# Patient Record
Sex: Female | Born: 1960 | Hispanic: No | Marital: Single | Smoking: Never smoker
Health system: Southern US, Community
[De-identification: ages and names within clinical notes are randomized; demographics above are authoritative.]

## PROBLEM LIST (undated history)

## (undated) ENCOUNTER — Ambulatory Visit (HOSPITAL_COMMUNITY): Admission: EM | Payer: Medicaid Other

## (undated) DIAGNOSIS — B20 Human immunodeficiency virus [HIV] disease: Secondary | ICD-10-CM

## (undated) DIAGNOSIS — I10 Essential (primary) hypertension: Secondary | ICD-10-CM

## (undated) DIAGNOSIS — Z21 Asymptomatic human immunodeficiency virus [HIV] infection status: Secondary | ICD-10-CM

## (undated) HISTORY — PX: TUBAL LIGATION: SHX77

---

## 2000-04-25 ENCOUNTER — Emergency Department (HOSPITAL_COMMUNITY): Admission: EM | Admit: 2000-04-25 | Discharge: 2000-04-25 | Payer: Self-pay | Admitting: Emergency Medicine

## 2001-05-07 ENCOUNTER — Encounter: Payer: Self-pay | Admitting: Emergency Medicine

## 2001-05-07 ENCOUNTER — Emergency Department (HOSPITAL_COMMUNITY): Admission: EM | Admit: 2001-05-07 | Discharge: 2001-05-07 | Payer: Self-pay | Admitting: Emergency Medicine

## 2001-05-10 ENCOUNTER — Emergency Department (HOSPITAL_COMMUNITY): Admission: EM | Admit: 2001-05-10 | Discharge: 2001-05-10 | Payer: Self-pay | Admitting: Emergency Medicine

## 2001-05-20 ENCOUNTER — Emergency Department (HOSPITAL_COMMUNITY): Admission: EM | Admit: 2001-05-20 | Discharge: 2001-05-20 | Payer: Self-pay | Admitting: Emergency Medicine

## 2012-10-04 ENCOUNTER — Emergency Department (HOSPITAL_BASED_OUTPATIENT_CLINIC_OR_DEPARTMENT_OTHER)
Admission: EM | Admit: 2012-10-04 | Discharge: 2012-10-04 | Disposition: A | Discharge status: Home / Self Care | Payer: Medicaid Other | Attending: Emergency Medicine | Admitting: Emergency Medicine

## 2012-10-04 ENCOUNTER — Encounter (HOSPITAL_BASED_OUTPATIENT_CLINIC_OR_DEPARTMENT_OTHER): Payer: Self-pay | Admitting: Family Medicine

## 2012-10-04 DIAGNOSIS — I10 Essential (primary) hypertension: Secondary | ICD-10-CM | POA: Insufficient documentation

## 2012-10-04 DIAGNOSIS — Z7982 Long term (current) use of aspirin: Secondary | ICD-10-CM | POA: Insufficient documentation

## 2012-10-04 DIAGNOSIS — B20 Human immunodeficiency virus [HIV] disease: Secondary | ICD-10-CM | POA: Insufficient documentation

## 2012-10-04 DIAGNOSIS — F17200 Nicotine dependence, unspecified, uncomplicated: Secondary | ICD-10-CM | POA: Insufficient documentation

## 2012-10-04 DIAGNOSIS — L0291 Cutaneous abscess, unspecified: Secondary | ICD-10-CM

## 2012-10-04 DIAGNOSIS — Z79899 Other long term (current) drug therapy: Secondary | ICD-10-CM | POA: Insufficient documentation

## 2012-10-04 DIAGNOSIS — L02419 Cutaneous abscess of limb, unspecified: Secondary | ICD-10-CM | POA: Insufficient documentation

## 2012-10-04 HISTORY — DX: Essential (primary) hypertension: I10

## 2012-10-04 HISTORY — DX: Asymptomatic human immunodeficiency virus (hiv) infection status: Z21

## 2012-10-04 HISTORY — DX: Human immunodeficiency virus (HIV) disease: B20

## 2012-10-04 MED ORDER — OXYCODONE-ACETAMINOPHEN 5-325 MG PO TABS: 1.0000 | ORAL_TABLET | Freq: Four times a day (QID) | ORAL | Status: DC | PRN

## 2012-10-04 MED ORDER — OXYCODONE-ACETAMINOPHEN 5-325 MG PO TABS
2.0000 | ORAL_TABLET | Freq: Once | ORAL | Status: AC
  Administered 2012-10-04: 2 via ORAL
  Filled 2012-10-04 (×2): qty 2

## 2012-10-04 MED ORDER — ONDANSETRON 4 MG PO TBDP
4.0000 mg | ORAL_TABLET | Freq: Once | ORAL | Status: AC
  Administered 2012-10-04: 4 mg via ORAL
  Filled 2012-10-04: qty 1

## 2012-10-04 MED ORDER — CEPHALEXIN 500 MG PO CAPS: 500.0000 mg | ORAL_CAPSULE | Freq: Four times a day (QID) | ORAL | Status: DC

## 2012-10-04 MED ORDER — SULFAMETHOXAZOLE-TRIMETHOPRIM 800-160 MG PO TABS: 1.0000 | ORAL_TABLET | Freq: Two times a day (BID) | ORAL | Status: DC

## 2012-10-04 NOTE — ED Provider Notes (Signed)
History     CSN: [id number]  Arrival date & time 10/04/12  1615   First MD Initiated Contact with Patient 10/04/12 1759      Chief Complaint  Patient presents with  . Abscess    (Consider location/radiation/quality/duration/timing/severity/associated sxs/prior treatment) HPI Comments: Patient presents with complaint of abscess on right inner thigh X 2 weeks. Patient states that the area is painful and has been draining yellow pus. She has not tried any OTC or home remedies. Denies fever or chills. Denies NVD or abdominal pain.   The history is provided by the patient. No language interpreter was used.    Past Medical History  Diagnosis Date  . Hypertension   . HIV (human immunodeficiency virus infection)     Past Surgical History  Procedure Date  . Tubal ligation     No family history on file.  History  Substance Use Topics  . Smoking status: Current Some Day Smoker  . Smokeless tobacco: Not on file  . Alcohol Use: Yes    OB History    Grav Para Term Preterm Abortions TAB SAB Ect Mult Living                  Review of Systems  Constitutional: Negative for fever and chills.  Gastrointestinal: Negative for nausea, vomiting, abdominal pain and diarrhea.  Skin: Positive for wound.    Allergies  Review of patient's allergies indicates no known allergies.  Home Medications   Current Outpatient Rx  Name  Route  Sig  Dispense  Refill  . ASPIRIN 81 MG PO TABS   Oral   Take 81 mg by mouth daily.         . TRUVADA PO   Oral   Take by mouth.         Marland Kitchen HYDROCHLOROTHIAZIDE 12.5 MG PO CAPS   Oral   Take 12.5 mg by mouth daily.         Marland Kitchen LISINOPRIL PO   Oral   Take by mouth.         Marland Kitchen ZOLOFT PO   Oral   Take by mouth.         Marland Kitchen SIMVASTATIN 10 MG PO TABS   Oral   Take 10 mg by mouth at bedtime.         Marland Kitchen GEODON PO   Oral   Take by mouth.           BP 120/103  Pulse 54  Temp 98.2 F (36.8 C) (Oral)  Resp 18  Ht 5\' 3"  (1.6 m)   Wt 215 lb (97.523 kg)  BMI 38.09 kg/m2  SpO2 99%  Physical Exam  Nursing note and vitals reviewed. Constitutional: She appears well-developed and well-nourished.  HENT:  Head: Normocephalic and atraumatic.  Mouth/Throat: Oropharynx is clear and moist.  Eyes: Conjunctivae normal and EOM are normal. No scleral icterus.  Neck: Normal range of motion. Neck supple.  Cardiovascular: Normal rate, regular rhythm and normal heart sounds.   Pulmonary/Chest: Effort normal and breath sounds normal.  Abdominal: Soft. There is no tenderness.  Lymphadenopathy:    She has no cervical adenopathy.  Neurological: She is alert.  Skin: Skin is warm and dry.       ED Course  Procedures (including critical care time)  Labs Reviewed - No data to display No results found.   1. Abscess       MDM  Patient presented with complaint of abscess. Patient became tearful, expressed fear  of needles, and refused I & D. Complication and risks explained to patient. Patient instructed to use warm compress and attempt to express pus at home. Discharged on bactrim and Keflex. Return precautions given verbally and in discharge summary. No red flags for cellulitis.        Pixie Casino, PA-C 10/04/12 2247

## 2012-10-04 NOTE — ED Notes (Signed)
Pt c/o abscess to right inner thigh x 2 wks. Pt sts it drains intermittently.

## 2012-10-05 NOTE — ED Provider Notes (Signed)
Medical screening examination/treatment/procedure(s) were performed by non-physician practitioner and as supervising physician I was immediately available for consultation/collaboration.   Rolan Bucco, MD 10/05/12 [phone]

## 2013-01-22 ENCOUNTER — Emergency Department (HOSPITAL_BASED_OUTPATIENT_CLINIC_OR_DEPARTMENT_OTHER)
Admission: EM | Admit: 2013-01-22 | Discharge: 2013-01-22 | Disposition: A | Discharge status: Home / Self Care | Payer: Medicaid Other | Attending: Emergency Medicine | Admitting: Emergency Medicine

## 2013-01-22 ENCOUNTER — Encounter (HOSPITAL_BASED_OUTPATIENT_CLINIC_OR_DEPARTMENT_OTHER): Payer: Self-pay | Admitting: *Deleted

## 2013-01-22 DIAGNOSIS — K0889 Other specified disorders of teeth and supporting structures: Secondary | ICD-10-CM

## 2013-01-22 DIAGNOSIS — Z7982 Long term (current) use of aspirin: Secondary | ICD-10-CM | POA: Insufficient documentation

## 2013-01-22 DIAGNOSIS — F17200 Nicotine dependence, unspecified, uncomplicated: Secondary | ICD-10-CM | POA: Insufficient documentation

## 2013-01-22 DIAGNOSIS — L02214 Cutaneous abscess of groin: Secondary | ICD-10-CM

## 2013-01-22 DIAGNOSIS — I10 Essential (primary) hypertension: Secondary | ICD-10-CM | POA: Insufficient documentation

## 2013-01-22 DIAGNOSIS — L02219 Cutaneous abscess of trunk, unspecified: Secondary | ICD-10-CM | POA: Insufficient documentation

## 2013-01-22 DIAGNOSIS — K089 Disorder of teeth and supporting structures, unspecified: Secondary | ICD-10-CM | POA: Insufficient documentation

## 2013-01-22 DIAGNOSIS — Z79899 Other long term (current) drug therapy: Secondary | ICD-10-CM | POA: Insufficient documentation

## 2013-01-22 DIAGNOSIS — B20 Human immunodeficiency virus [HIV] disease: Secondary | ICD-10-CM | POA: Insufficient documentation

## 2013-01-22 DIAGNOSIS — L03319 Cellulitis of trunk, unspecified: Secondary | ICD-10-CM | POA: Insufficient documentation

## 2013-01-22 MED ORDER — HYDROCODONE-ACETAMINOPHEN 5-325 MG PO TABS: 1.0000 | ORAL_TABLET | ORAL | Status: DC | PRN

## 2013-01-22 MED ORDER — CEPHALEXIN 500 MG PO CAPS: 500.0000 mg | ORAL_CAPSULE | Freq: Three times a day (TID) | ORAL | Status: DC

## 2013-01-22 MED ORDER — SULFAMETHOXAZOLE-TRIMETHOPRIM 800-160 MG PO TABS: 1.0000 | ORAL_TABLET | Freq: Two times a day (BID) | ORAL | Status: DC

## 2013-01-22 MED ORDER — OXYCODONE-ACETAMINOPHEN 5-325 MG PO TABS
1.0000 | ORAL_TABLET | Freq: Once | ORAL | Status: AC
  Administered 2013-01-22: 1 via ORAL
  Filled 2013-01-22 (×2): qty 1

## 2013-01-22 NOTE — ED Notes (Signed)
Pt states she has had a boil to the right groin x 2 weeks and a toothache that just started.

## 2013-01-22 NOTE — ED Provider Notes (Signed)
Medical screening examination/treatment/procedure(s) were performed by non-physician practitioner and as supervising physician I was immediately available for consultation/collaboration.   Charles B. Bernette Mayers, MD 01/22/13 [phone]

## 2013-01-22 NOTE — ED Provider Notes (Signed)
History     CSN: [id number]  Arrival date & time 01/22/13  1427   First MD Initiated Contact with Patient 01/22/13 1453      Chief Complaint  Patient presents with  . Recurrent Skin Infections    (Consider location/radiation/quality/duration/timing/severity/associated sxs/prior Treatment)  HPI Christine Ward is a 52 y.o. female who presents to the ED with a skin problem. There is an area in the right groin that has been tender for 2 weeks and has been draining for the past 2 days. This is a recurrent problem. She was here a few months back for the same problem and was treated with 2 antibiotics. The area went completely away and she was doing well until 2 days ago. She denies vaginal discharge, fever, nausea or vomiting or abdominal pain. She also complains of a tooth ache. The history was provided by the patient.  Past Medical History  Diagnosis Date  . Hypertension   . HIV (human immunodeficiency virus infection)     Past Surgical History  Procedure Laterality Date  . Tubal ligation      History reviewed. No pertinent family history.  History  Substance Use Topics  . Smoking status: Current Some Day Smoker  . Smokeless tobacco: Not on file  . Alcohol Use: Yes    OB History   Grav Para Term Preterm Abortions TAB SAB Ect Mult Living                  Review of Systems  Constitutional: Negative for fever, chills, diaphoresis and fatigue.  HENT: Positive for dental problem. Negative for ear pain, congestion, sore throat, facial swelling, neck pain, neck stiffness and sinus pressure.   Eyes: Negative for photophobia, pain and discharge.  Respiratory: Negative for cough, chest tightness and wheezing.   Cardiovascular: Negative for chest pain and palpitations.  Gastrointestinal: Negative for nausea, vomiting, abdominal pain, diarrhea, constipation and abdominal distention.  Genitourinary: Negative for dysuria, frequency, flank pain, vaginal bleeding, vaginal discharge,  difficulty urinating and pelvic pain.  Musculoskeletal: Negative for myalgias, back pain and gait problem.  Skin:       Abscess right inguinal area.  Allergic/Immunologic: Positive for immunocompromised state.  Neurological: Negative for dizziness, speech difficulty, weakness, light-headedness, numbness and headaches.  Psychiatric/Behavioral: Negative for confusion and agitation. The patient is not nervous/anxious.     Allergies  Review of patient's allergies indicates no known allergies.  Home Medications   Current Outpatient Rx  Name  Route  Sig  Dispense  Refill  . aspirin 81 MG tablet   Oral   Take 81 mg by mouth daily.         . cephALEXin (KEFLEX) 500 MG capsule   Oral   Take 1 capsule (500 mg total) by mouth 4 (four) times daily.   40 capsule   0   . Emtricitabine-Tenofovir (TRUVADA PO)   Oral   Take by mouth.         . hydrochlorothiazide (MICROZIDE) 12.5 MG capsule   Oral   Take 12.5 mg by mouth daily.         Marland Kitchen LISINOPRIL PO   Oral   Take by mouth.         . oxyCODONE-acetaminophen (PERCOCET/ROXICET) 5-325 MG per tablet   Oral   Take 1 tablet by mouth every 6 (six) hours as needed for pain.   10 tablet   0   . Sertraline HCl (ZOLOFT PO)   Oral   Take by mouth.         Marland Kitchen  simvastatin (ZOCOR) 10 MG tablet   Oral   Take 10 mg by mouth at bedtime.         . sulfamethoxazole-trimethoprim (SEPTRA DS) 800-160 MG per tablet   Oral   Take 1 tablet by mouth 2 (two) times daily.   20 tablet   0   . Ziprasidone HCl (GEODON PO)   Oral   Take by mouth.           BP 129/70  Pulse 60  Temp(Src) 98.2 F (36.8 C) (Oral)  Resp 20  Ht 5\' 3"  (1.6 m)  Wt 208 lb (94.348 kg)  BMI 36.85 kg/m2  SpO2 99%  Physical Exam  Nursing note and vitals reviewed. Constitutional: She is oriented to person, place, and time. She appears well-developed and well-nourished. No distress.  HENT:  Head: Normocephalic and atraumatic.  Nose: Nose normal.    Mouth/Throat: Uvula is midline, oropharynx is clear and moist and mucous membranes are normal.    Eyes: EOM are normal.  Neck: Neck supple.  Cardiovascular: Normal rate.   Pulmonary/Chest: Effort normal.  Abdominal: Soft. There is no tenderness. There is no CVA tenderness.  Abscess that is draining right inguinal area. Tender on palpation.   Musculoskeletal: Normal range of motion.  Neurological: She is alert and oriented to person, place, and time. No cranial nerve deficit.  Skin: Skin is warm and dry.  Psychiatric: Her speech is normal and behavior is normal. Judgment and thought content normal. Her mood appears anxious. Cognition and memory are normal.   Procedures  Assessment: 52 y.o. female with right inguinal abscess   Dental pain   HIV  Plan:  Antibiotics   Pain management   Follow up with PCP Discussed with the patient and all questioned fully answered. She will return if any problems arise.    Medication List    TAKE these medications       cephALEXin 500 MG capsule  Commonly known as:  KEFLEX  Take 1 capsule (500 mg total) by mouth 3 (three) times daily.     HYDROcodone-acetaminophen 5-325 MG per tablet  Commonly known as:  NORCO/VICODIN  Take 1 tablet by mouth every 4 (four) hours as needed for pain.     sulfamethoxazole-trimethoprim 800-160 MG per tablet  Commonly known as:  SEPTRA DS  Take 1 tablet by mouth every 12 (twelve) hours.      ASK your doctor about these medications       aspirin 81 MG tablet  Take 81 mg by mouth daily.     GEODON PO  Take by mouth.     hydrochlorothiazide 12.5 MG capsule  Commonly known as:  MICROZIDE  Take 12.5 mg by mouth daily.     LISINOPRIL PO  Take by mouth.     oxyCODONE-acetaminophen 5-325 MG per tablet  Commonly known as:  PERCOCET/ROXICET  Take 1 tablet by mouth every 6 (six) hours as needed for pain.     simvastatin 10 MG tablet  Commonly known as:  ZOCOR  Take 10 mg by mouth at bedtime.     TRUVADA  PO  Take by mouth.     ZOLOFT PO  Take by mouth.           Janne Napoleon, [place] 01/22/13 1549

## 2013-06-23 ENCOUNTER — Emergency Department (HOSPITAL_BASED_OUTPATIENT_CLINIC_OR_DEPARTMENT_OTHER)
Admission: EM | Admit: 2013-06-23 | Discharge: 2013-06-23 | Disposition: A | Discharge status: Home / Self Care | Payer: Medicaid Other | Attending: Emergency Medicine | Admitting: Emergency Medicine

## 2013-06-23 ENCOUNTER — Encounter (HOSPITAL_BASED_OUTPATIENT_CLINIC_OR_DEPARTMENT_OTHER): Payer: Self-pay | Admitting: *Deleted

## 2013-06-23 DIAGNOSIS — T63461A Toxic effect of venom of wasps, accidental (unintentional), initial encounter: Secondary | ICD-10-CM | POA: Insufficient documentation

## 2013-06-23 DIAGNOSIS — T6391XA Toxic effect of contact with unspecified venomous animal, accidental (unintentional), initial encounter: Secondary | ICD-10-CM | POA: Insufficient documentation

## 2013-06-23 DIAGNOSIS — I10 Essential (primary) hypertension: Secondary | ICD-10-CM | POA: Insufficient documentation

## 2013-06-23 DIAGNOSIS — Y929 Unspecified place or not applicable: Secondary | ICD-10-CM | POA: Insufficient documentation

## 2013-06-23 DIAGNOSIS — Y939 Activity, unspecified: Secondary | ICD-10-CM | POA: Insufficient documentation

## 2013-06-23 DIAGNOSIS — Z7982 Long term (current) use of aspirin: Secondary | ICD-10-CM | POA: Insufficient documentation

## 2013-06-23 DIAGNOSIS — Z21 Asymptomatic human immunodeficiency virus [HIV] infection status: Secondary | ICD-10-CM | POA: Insufficient documentation

## 2013-06-23 DIAGNOSIS — R21 Rash and other nonspecific skin eruption: Secondary | ICD-10-CM | POA: Insufficient documentation

## 2013-06-23 DIAGNOSIS — Z79899 Other long term (current) drug therapy: Secondary | ICD-10-CM | POA: Insufficient documentation

## 2013-06-23 DIAGNOSIS — F17200 Nicotine dependence, unspecified, uncomplicated: Secondary | ICD-10-CM | POA: Insufficient documentation

## 2013-06-23 MED ORDER — NAPROXEN 500 MG PO TABS: 500.0000 mg | ORAL_TABLET | Freq: Two times a day (BID) | ORAL | Status: DC

## 2013-06-23 MED ORDER — DIPHENHYDRAMINE HCL 25 MG PO TABS: 25.0000 mg | ORAL_TABLET | Freq: Four times a day (QID) | ORAL | Status: DC | PRN

## 2013-06-23 NOTE — ED Provider Notes (Signed)
Medical screening examination/treatment/procedure(s) were performed by non-physician practitioner and as supervising physician I was immediately available for consultation/collaboration.  Dayan Desa, MD 06/23/13 2051 

## 2013-06-23 NOTE — ED Provider Notes (Signed)
CSN: [id number]     Arrival date & time 06/23/13  1814 History     First MD Initiated Contact with Patient 06/23/13 1826     Chief Complaint  Patient presents with  . Insect Bite   (Consider location/radiation/quality/duration/timing/severity/associated sxs/prior Treatment) HPI Comments: Patient is a 52 year old female with a past medical history of hypertension and HIV who presents after being stung by a bee on her right breast prior to arrival. Patient reports the bee flew into her shirt and stung her breast. Patient reports sudden onset of burning pain to her right breast without radiation. No aggravating/alleviating factors. No associated symptoms. Patient denies throat closing, SOB, difficulty swallowing.    Past Medical History  Diagnosis Date  . Hypertension   . HIV (human immunodeficiency virus infection)    Past Surgical History  Procedure Laterality Date  . Tubal ligation     No family history on file. History  Substance Use Topics  . Smoking status: Current Some Day Smoker  . Smokeless tobacco: Not on file  . Alcohol Use: Yes   OB History   Grav Para Term Preterm Abortions TAB SAB Ect Mult Living                 Review of Systems  Skin: Positive for rash.  All other systems reviewed and are negative.    Allergies  Review of patient's allergies indicates no known allergies.  Home Medications   Current Outpatient Rx  Name  Route  Sig  Dispense  Refill  . aspirin 81 MG tablet   Oral   Take 81 mg by mouth daily.         . cephALEXin (KEFLEX) 500 MG capsule   Oral   Take 1 capsule (500 mg total) by mouth 3 (three) times daily.   30 capsule   0   . Emtricitabine-Tenofovir (TRUVADA PO)   Oral   Take by mouth.         . hydrochlorothiazide (MICROZIDE) 12.5 MG capsule   Oral   Take 12.5 mg by mouth daily.         Marland Kitchen HYDROcodone-acetaminophen (NORCO/VICODIN) 5-325 MG per tablet   Oral   Take 1 tablet by mouth every 4 (four) hours as needed for  pain.   10 tablet   0   . LISINOPRIL PO   Oral   Take by mouth.         . oxyCODONE-acetaminophen (PERCOCET/ROXICET) 5-325 MG per tablet   Oral   Take 1 tablet by mouth every 6 (six) hours as needed for pain.   10 tablet   0   . Sertraline HCl (ZOLOFT PO)   Oral   Take by mouth.         . simvastatin (ZOCOR) 10 MG tablet   Oral   Take 10 mg by mouth at bedtime.         . sulfamethoxazole-trimethoprim (SEPTRA DS) 800-160 MG per tablet   Oral   Take 1 tablet by mouth every 12 (twelve) hours.   20 tablet   0   . Ziprasidone HCl (GEODON PO)   Oral   Take by mouth.          BP 138/77  Pulse 72  Temp(Src) 97.9 F (36.6 C) (Oral)  Resp 18  Ht 5\' 3"  (1.6 m)  Wt 214 lb (97.07 kg)  BMI 37.92 kg/m2  SpO2 100% Physical Exam  Nursing note and vitals reviewed. Constitutional: She is oriented  to person, place, and time. She appears well-developed and well-nourished. No distress.  HENT:  Head: Normocephalic and atraumatic.  Eyes: Conjunctivae are normal.  Neck: Normal range of motion.  Cardiovascular: Normal rate and regular rhythm.  Exam reveals no gallop and no friction rub.   No murmur heard. Pulmonary/Chest: Effort normal and breath sounds normal. She has no wheezes. She has no rales. She exhibits no tenderness.  Abdominal: Soft. She exhibits no distension. There is no tenderness. There is no rebound and no guarding.  Musculoskeletal: Normal range of motion.  Neurological: She is alert and oriented to person, place, and time. Coordination normal.  Speech is goal-oriented. Moves limbs without ataxia.   Skin: Skin is warm and dry.  Localized erythema with central sting mark to right upper lateral breast that is mildly tender to palpate.   Psychiatric: She has a normal mood and affect. Her behavior is normal.    ED Course   Procedures (including critical care time)  Labs Reviewed - No data to display No results found.  1. Bee sting, initial encounter      MDM  6:36 PM Patient reassured the reaction is localized to her right breast. Patient denies wheezing, SOB, throat closing. Patient will have benadryl and Naprosyn. No further evaluation needed at this time.   Emilia Beck, PA-C 06/23/13 1845

## 2013-06-23 NOTE — ED Notes (Signed)
Bee sting to her right breast just now. No allergic reaction symptoms.

## 2014-11-21 ENCOUNTER — Emergency Department (HOSPITAL_BASED_OUTPATIENT_CLINIC_OR_DEPARTMENT_OTHER)
Admission: EM | Admit: 2014-11-21 | Discharge: 2014-11-21 | Disposition: A | Discharge status: Home / Self Care | Payer: Medicaid Other | Attending: Emergency Medicine | Admitting: Emergency Medicine

## 2014-11-21 ENCOUNTER — Encounter (HOSPITAL_BASED_OUTPATIENT_CLINIC_OR_DEPARTMENT_OTHER): Payer: Self-pay | Admitting: Emergency Medicine

## 2014-11-21 ENCOUNTER — Emergency Department (HOSPITAL_BASED_OUTPATIENT_CLINIC_OR_DEPARTMENT_OTHER): Payer: Medicaid Other

## 2014-11-21 DIAGNOSIS — M542 Cervicalgia: Secondary | ICD-10-CM | POA: Insufficient documentation

## 2014-11-21 DIAGNOSIS — B20 Human immunodeficiency virus [HIV] disease: Secondary | ICD-10-CM

## 2014-11-21 DIAGNOSIS — I10 Essential (primary) hypertension: Secondary | ICD-10-CM | POA: Diagnosis not present

## 2014-11-21 DIAGNOSIS — R51 Headache: Secondary | ICD-10-CM | POA: Diagnosis present

## 2014-11-21 DIAGNOSIS — G44209 Tension-type headache, unspecified, not intractable: Secondary | ICD-10-CM | POA: Diagnosis not present

## 2014-11-21 DIAGNOSIS — R519 Headache, unspecified: Secondary | ICD-10-CM

## 2014-11-21 DIAGNOSIS — Z791 Long term (current) use of non-steroidal anti-inflammatories (NSAID): Secondary | ICD-10-CM | POA: Diagnosis not present

## 2014-11-21 DIAGNOSIS — Z792 Long term (current) use of antibiotics: Secondary | ICD-10-CM | POA: Insufficient documentation

## 2014-11-21 DIAGNOSIS — Z79899 Other long term (current) drug therapy: Secondary | ICD-10-CM | POA: Insufficient documentation

## 2014-11-21 DIAGNOSIS — Z21 Asymptomatic human immunodeficiency virus [HIV] infection status: Secondary | ICD-10-CM | POA: Insufficient documentation

## 2014-11-21 DIAGNOSIS — Z7982 Long term (current) use of aspirin: Secondary | ICD-10-CM | POA: Diagnosis not present

## 2014-11-21 DIAGNOSIS — Z720 Tobacco use: Secondary | ICD-10-CM | POA: Insufficient documentation

## 2014-11-21 MED ORDER — CYCLOBENZAPRINE HCL 10 MG PO TABS
5.0000 mg | ORAL_TABLET | Freq: Once | ORAL | Status: AC
  Administered 2014-11-21: 5 mg via ORAL
  Filled 2014-11-21: qty 1

## 2014-11-21 MED ORDER — MORPHINE SULFATE 2 MG/ML IJ SOLN
INTRAMUSCULAR | Status: AC
  Filled 2014-11-21: qty 1

## 2014-11-21 MED ORDER — MORPHINE SULFATE 4 MG/ML IJ SOLN
6.0000 mg | Freq: Once | INTRAMUSCULAR | Status: AC
  Administered 2014-11-21: 6 mg via INTRAMUSCULAR

## 2014-11-21 MED ORDER — MORPHINE SULFATE 4 MG/ML IJ SOLN
INTRAMUSCULAR | Status: AC
  Filled 2014-11-21: qty 1

## 2014-11-21 MED ORDER — NAPROXEN SODIUM 220 MG PO TABS: 220.0000 mg | ORAL_TABLET | Freq: Two times a day (BID) | ORAL | Status: DC

## 2014-11-21 MED ORDER — SODIUM CHLORIDE 0.9 % IV BOLUS (SEPSIS): 1000.0000 mL | Freq: Once | INTRAVENOUS | Status: DC

## 2014-11-21 MED ORDER — CYCLOBENZAPRINE HCL 10 MG PO TABS: 10.0000 mg | ORAL_TABLET | Freq: Two times a day (BID) | ORAL | Status: DC | PRN

## 2014-11-21 NOTE — ED Notes (Signed)
Pt having headache on the back of her head radiating down her neck for several days.  Pt denies fever.  No N/V

## 2014-11-21 NOTE — ED Notes (Signed)
MD at bedside. 

## 2014-11-21 NOTE — ED Provider Notes (Signed)
CSN: [id number]     Arrival date & time 11/21/14  1015 History   First MD Initiated Contact with Patient 11/21/14 1107     Chief Complaint  Patient presents with  . Headache     (Consider location/radiation/quality/duration/timing/severity/associated sxs/prior Treatment) Patient is a 53 y.o. female presenting with headaches. The history is provided by the patient. No language interpreter was used.  Headache Pain location:  Occipital Quality:  Stabbing Radiates to: neck & upper back. Severity currently:  10/10 Severity at highest:  10/10 Onset quality:  Unable to specify Duration:  5 days Timing:  Constant Progression:  Waxing and waning Chronicity:  New Similar to prior headaches: no   Relieved by: ibuprofen. Worsened by:  Neck movement Associated symptoms: neck pain   Associated symptoms: no abdominal pain, no back pain, no blurred vision, no congestion, no cough, no diarrhea, no dizziness, no fatigue, no fever, no focal weakness, no nausea, no near-syncope, no neck stiffness, no numbness, no photophobia, no seizures, no sinus pressure, no sore throat and no vomiting   Risk factors comment:  HIV   Past Medical History  Diagnosis Date  . Hypertension   . HIV (human immunodeficiency virus infection)    Past Surgical History  Procedure Laterality Date  . Tubal ligation     No family history on file. History  Substance Use Topics  . Smoking status: Current Some Day Smoker  . Smokeless tobacco: Not on file  . Alcohol Use: Yes   OB History    No data available     Review of Systems  Constitutional: Negative for fever, chills, diaphoresis, activity change, appetite change and fatigue.  HENT: Negative for congestion, facial swelling, rhinorrhea, sinus pressure and sore throat.   Eyes: Negative for blurred vision, photophobia and discharge.  Respiratory: Negative for cough, chest tightness and shortness of breath.   Cardiovascular: Negative for chest pain,  palpitations, leg swelling and near-syncope.  Gastrointestinal: Negative for nausea, vomiting, abdominal pain and diarrhea.  Endocrine: Negative for polydipsia and polyuria.  Genitourinary: Negative for dysuria, frequency, difficulty urinating and pelvic pain.  Musculoskeletal: Positive for neck pain. Negative for back pain, arthralgias and neck stiffness.  Skin: Negative for color change and wound.  Allergic/Immunologic: Negative for immunocompromised state.  Neurological: Positive for headaches. Negative for dizziness, focal weakness, seizures, facial asymmetry, weakness and numbness.  Hematological: Does not bruise/bleed easily.  Psychiatric/Behavioral: Negative for confusion and agitation.      Allergies  Review of patient's allergies indicates no known allergies.  Home Medications   Prior to Admission medications   Medication Sig Start Date End Date Taking? Authorizing Provider  aspirin 81 MG tablet Take 81 mg by mouth daily.    Historical Provider, MD  cephALEXin (KEFLEX) 500 MG capsule Take 1 capsule (500 mg total) by mouth 3 (three) times daily. 01/22/13   Hope Orlene OchM Neese, NP  diphenhydrAMINE (BENADRYL) 25 MG tablet Take 1 tablet (25 mg total) by mouth every 6 (six) hours as needed for itching. 06/23/13   Emilia BeckKaitlyn Szekalski, PA-C  Emtricitabine-Tenofovir (TRUVADA PO) Take by mouth.    Historical Provider, MD  hydrochlorothiazide (MICROZIDE) 12.5 MG capsule Take 12.5 mg by mouth daily.    Historical Provider, MD  HYDROcodone-acetaminophen (NORCO/VICODIN) 5-325 MG per tablet Take 1 tablet by mouth every 4 (four) hours as needed for pain. 01/22/13   Hope Orlene OchM Neese, NP  LISINOPRIL PO Take by mouth.    Historical Provider, MD  naproxen (NAPROSYN) 500 MG tablet Take 1 tablet (  500 mg total) by mouth 2 (two) times daily with a meal. 06/23/13   Emilia BeckKaitlyn Szekalski, PA-C  oxyCODONE-acetaminophen (PERCOCET/ROXICET) 5-325 MG per tablet Take 1 tablet by mouth every 6 (six) hours as needed for pain.  10/04/12   Pixie Casinoia Oliveri, PA-C  Sertraline HCl (ZOLOFT PO) Take by mouth.    Historical Provider, MD  simvastatin (ZOCOR) 10 MG tablet Take 10 mg by mouth at bedtime.    Historical Provider, MD  sulfamethoxazole-trimethoprim (SEPTRA DS) 800-160 MG per tablet Take 1 tablet by mouth every 12 (twelve) hours. 01/22/13   Hope Orlene OchM Neese, NP  Ziprasidone HCl (GEODON PO) Take by mouth.    Historical Provider, MD   BP 133/65 mmHg  Pulse 61  Temp(Src) 98.2 F (36.8 C) (Oral)  Resp 20  Ht 5\' 3"  (1.6 m)  Wt 234 lb (106.142 kg)  BMI 41.46 kg/m2  SpO2 98% Physical Exam  Constitutional: She is oriented to person, place, and time. She appears well-developed and well-nourished. No distress.  HENT:  Head: Normocephalic and atraumatic.    Mouth/Throat: No oropharyngeal exudate.  Eyes: Pupils are equal, round, and reactive to light.  Neck: Normal range of motion. Neck supple. Muscular tenderness present. No spinous process tenderness present. No rigidity.    Cardiovascular: Normal rate, regular rhythm and normal heart sounds.  Exam reveals no gallop and no friction rub.   No murmur heard. Pulmonary/Chest: Effort normal and breath sounds normal. No respiratory distress. She has no wheezes. She has no rales.  Abdominal: Soft. Bowel sounds are normal. She exhibits no distension and no mass. There is no tenderness. There is no rebound and no guarding.  Musculoskeletal: Normal range of motion. She exhibits no edema or tenderness.  Neurological: She is alert and oriented to person, place, and time.  Skin: Skin is warm and dry.  Psychiatric: She has a normal mood and affect.    ED Course  Procedures (including critical care time) Labs Review Labs Reviewed - No data to display  Imaging Review No results found.   EKG Interpretation None      MDM   Final diagnoses:  Headache  HIV (human immunodeficiency virus infection)    Pt is a 53 y.o. female with Pmhx as above who presents with 5 days of  posterior R sided h/a, neck pain. No assoc n/v, fever, visual changes, numbness or weakness. On PE, VSS, pt in NAD no focal neuro findings.  Patient has muscular tenderness over right sided paraspinal muscles, right sided trapezius muscles and right occipital skull muscles.  History of HIV in no strong history of headaches in the past, CT head ordered and was negative for acute findings. Pt feeling improved after morphine & flexeril. I suspect tension-type h/a based on PE. Doubt SAH, meningitis, CVA/TIA. Will d/c home w/ NSAIDS and muscle relaxer.     Jaeleen Charo evaluation in the Emergency Department is complete. It has been determined that no acute conditions requiring further emergency intervention are present at this time. The patient/guardian have been advised of the diagnosis and plan. We have discussed signs and symptoms that warrant return to the ED, such as changes or worsening in symptoms, worsening pain, fever, numbness, weakness confusion.       Toy CookeyMegan Lovenia Debruler, MD 11/21/14 [phone]

## 2014-11-21 NOTE — ED Notes (Signed)
Patient transported & returned from radiology.

## 2014-11-21 NOTE — ED Notes (Signed)
Patient given warm pack to apply to right side of neck.

## 2014-11-21 NOTE — Discharge Instructions (Signed)

## 2015-03-25 ENCOUNTER — Emergency Department (HOSPITAL_BASED_OUTPATIENT_CLINIC_OR_DEPARTMENT_OTHER)
Admission: EM | Admit: 2015-03-25 | Discharge: 2015-03-25 | Disposition: A | Discharge status: Home / Self Care | Payer: Medicaid Other | Attending: Emergency Medicine | Admitting: Emergency Medicine

## 2015-03-25 ENCOUNTER — Encounter (HOSPITAL_BASED_OUTPATIENT_CLINIC_OR_DEPARTMENT_OTHER): Payer: Self-pay | Admitting: *Deleted

## 2015-03-25 DIAGNOSIS — Z792 Long term (current) use of antibiotics: Secondary | ICD-10-CM | POA: Insufficient documentation

## 2015-03-25 DIAGNOSIS — L089 Local infection of the skin and subcutaneous tissue, unspecified: Secondary | ICD-10-CM | POA: Diagnosis present

## 2015-03-25 DIAGNOSIS — Z7982 Long term (current) use of aspirin: Secondary | ICD-10-CM | POA: Diagnosis not present

## 2015-03-25 DIAGNOSIS — Z720 Tobacco use: Secondary | ICD-10-CM | POA: Diagnosis not present

## 2015-03-25 DIAGNOSIS — I10 Essential (primary) hypertension: Secondary | ICD-10-CM | POA: Insufficient documentation

## 2015-03-25 DIAGNOSIS — Z79899 Other long term (current) drug therapy: Secondary | ICD-10-CM | POA: Insufficient documentation

## 2015-03-25 DIAGNOSIS — L732 Hidradenitis suppurativa: Secondary | ICD-10-CM | POA: Diagnosis not present

## 2015-03-25 DIAGNOSIS — B20 Human immunodeficiency virus [HIV] disease: Secondary | ICD-10-CM | POA: Insufficient documentation

## 2015-03-25 MED ORDER — HYDROCODONE-ACETAMINOPHEN 5-325 MG PO TABS: 1.0000 | ORAL_TABLET | Freq: Four times a day (QID) | ORAL | Status: AC | PRN

## 2015-03-25 MED ORDER — DOXYCYCLINE HYCLATE 100 MG PO TABS
100.0000 mg | ORAL_TABLET | Freq: Once | ORAL | Status: AC
  Administered 2015-03-25: 100 mg via ORAL
  Filled 2015-03-25: qty 1

## 2015-03-25 MED ORDER — DOXYCYCLINE HYCLATE 100 MG PO CAPS: 100.0000 mg | ORAL_CAPSULE | Freq: Two times a day (BID) | ORAL | Status: DC

## 2015-03-25 MED ORDER — FENTANYL CITRATE (PF) 100 MCG/2ML IJ SOLN
50.0000 ug | Freq: Once | INTRAMUSCULAR | Status: AC
  Administered 2015-03-25: 50 ug via INTRAMUSCULAR
  Filled 2015-03-25: qty 2

## 2015-03-25 NOTE — ED Notes (Signed)
Multiple open wounds on bilateral groins.

## 2015-03-25 NOTE — Discharge Instructions (Signed)
Hidradenitis Suppurativa, Sweat Gland Abscess Hidradenitis suppurativa is a long lasting (chronic), uncommon disease of the sweat glands. With this, boil-like lumps and scarring develop in the groin, some times under the arms (axillae), and under the breasts. It may also uncommonly occur behind the ears, in the crease of the buttocks, and around the genitals.  CAUSES  The cause is from a blocking of the sweat glands. They then become infected. It may cause drainage and odor. It is not contagious. So it cannot be given to someone else. It most often shows up in puberty (about 1810 to 52 years of age). But it may happen much later. It is similar to acne which is a disease of the sweat glands. This condition is slightly more common in African-Americans and women. SYMPTOMS   Hidradenitis usually starts as one or more red, tender, swellings in the groin or under the arms (axilla).  Over a period of hours to days the lesions get larger. They often open to the skin surface, draining clear to yellow-colored fluid.  The infected area heals with scarring. DIAGNOSIS  Your caregiver makes this diagnosis by looking at you. Sometimes cultures (growing germs on plates in the lab) may be taken. This is to see what germ (bacterium) is causing the infection.  TREATMENT   Topical germ killing medicine applied to the skin (antibiotics) are the treatment of choice. Antibiotics taken by mouth (systemic) are sometimes needed when the condition is getting worse or is severe.  Avoid tight-fitting clothing which traps moisture in.  Dirt does not cause hidradenitis and it is not caused by poor hygiene.  Involved areas should be cleaned daily using an antibacterial soap. Some patients find that the liquid form of Lever 2000, applied to the involved areas as a lotion after bathing, can help reduce the odor related to this condition.  Sometimes surgery is needed to drain infected areas or remove scarred tissue. Removal of  large amounts of tissue is used only in severe cases.  Birth control pills may be helpful.  Oral retinoids (vitamin A derivatives) for 6 to 12 months which are effective for acne may also help this condition.  Weight loss will improve but not cure hidradenitis. It is made worse by being overweight. But the condition is not caused by being overweight.  This condition is more common in people who have had acne.  It may become worse under stress. There is no medical cure for hidradenitis. It can be controlled, but not cured. The condition usually continues for years with periods of getting worse and getting better (remission). Document Released: 06/24/2004 Document Revised: 02/02/2012 Document Reviewed: 02/10/2014 [facility] Patient Information 2015 [employer] [place]. This information is not intended to replace advice given to you by your health care provider. Make sure you discuss any questions you have with your health care provider.  Follow-up with your infectious disease doctor tomorrow. Take antibiotic as directed. Take pain medicine as needed. Soak the area in warm water of daily.

## 2015-03-25 NOTE — ED Provider Notes (Signed)
CSN: [id number]     Arrival date & time 03/25/15  1502 History  This chart was scribed for Vanetta MuldersScott Kobi Aller, MD by Roxy Cedarhandni Bhalodia, ED Scribe. This patient was seen in room MH12/MH12 and the patient's care was started at 4:02 PM.   Chief Complaint  Patient presents with  . Recurrent Skin Infections    Patient is a 54 y.o. female presenting with rash. The history is provided by the patient. No language interpreter was used.  Rash Location:  Ano-genital Ano-genital rash location:  Vagina Quality: blistering, bruising, draining, painful and redness   Pain details:    Quality:  Aching, burning, sore and throbbing   Severity:  Moderate   Onset quality:  Gradual   Duration:  2 weeks   Timing:  Constant   Progression:  Worsening Severity:  Severe Relieved by:  Nothing Associated symptoms: no abdominal pain, no diarrhea, no fever, no headaches, no nausea, no shortness of breath, no sore throat and not vomiting    HPI Comments: Christine Ward is a [age] y.o. female with a PMHx of hypertension and HIV, who presents to the Emergency Department complaining of moderate "open sores in groin area". Patient states that this is a recurrent problem and is usually treated with a pennicilin injection and doxycycline. [facility] and was last seen 2 weeks ago. She states that her symptoms worsened since she was last seen. She states that her doctor discussed conducting plastic surgery for treatment.  Past Medical History  Diagnosis Date  . Hypertension   . HIV (human immunodeficiency virus infection)    Past Surgical History  Procedure Laterality Date  . Tubal ligation     History reviewed. No pertinent family history. History  Substance Use Topics  . Smoking status: Current Some Day Smoker  . Smokeless tobacco: Not on file  . Alcohol Use: Yes   OB History    No data available     Review of Systems  Constitutional: Negative for fever and  chills.  HENT: Negative for congestion, rhinorrhea and sore throat.   Eyes: Negative for visual disturbance.  Respiratory: Negative for cough and shortness of breath.   Cardiovascular: Negative for chest pain and leg swelling.  Gastrointestinal: Negative for nausea, vomiting, abdominal pain and diarrhea.  Genitourinary: Negative for dysuria.  Musculoskeletal: Negative for back pain.  Skin: Positive for wound. Negative for rash.  Neurological: Negative for headaches.  Hematological: Does not bruise/bleed easily.  Psychiatric/Behavioral: Negative for confusion.   Allergies  Review of patient's allergies indicates no known allergies.  Home Medications   Prior to Admission medications   Medication Sig Start Date End Date Taking? Authorizing Provider  aspirin 81 MG tablet Take 81 mg by mouth daily.    Historical Provider, MD  doxycycline (VIBRAMYCIN) 100 MG capsule Take 1 capsule (100 mg total) by mouth 2 (two) times daily. 03/25/15   Vanetta MuldersScott Sye Schroepfer, MD  Emtricitabine-Tenofovir (TRUVADA PO) Take by mouth.    Historical Provider, MD  hydrochlorothiazide (MICROZIDE) 12.5 MG capsule Take 12.5 mg by mouth daily.    Historical Provider, MD  HYDROcodone-acetaminophen (NORCO/VICODIN) 5-325 MG per tablet Take 1-2 tablets by mouth every 6 (six) hours as needed for moderate pain. 03/25/15   Vanetta MuldersScott Tenishia Ekman, MD  LISINOPRIL PO Take by mouth.    Historical Provider, MD  Sertraline HCl (ZOLOFT PO) Take by mouth.    Historical Provider, MD  simvastatin (ZOCOR) 10 MG tablet Take 10 mg by mouth at  bedtime.    Historical Provider, MD  Ziprasidone HCl (GEODON PO) Take by mouth.    Historical Provider, MD   Triage Vitals: BP 114/68 mmHg  Pulse 64  Temp(Src) 98.1 F (36.7 C) (Oral)  Resp 18  Ht  (1.6 m)  Wt 234 lb (106.142 kg)  BMI 41.46 kg/m2  SpO2 100%  Physical Exam  Constitutional: She is oriented to person, place, and time. She appears well-developed and well-nourished. No distress.  HENT:   Head: Normocephalic and atraumatic.  Mouth/Throat: Oropharynx is clear and moist. No oropharyngeal exudate.  Eyes: Conjunctivae and EOM are normal. Pupils are equal, round, and reactive to light.  Neck: Neck supple. No tracheal deviation present.  Cardiovascular: Normal rate, regular rhythm and normal heart sounds.   No murmur heard. Pulmonary/Chest: Effort normal. No respiratory distress.  Abdominal: Soft. Bowel sounds are normal. There is no tenderness.  Musculoskeletal: Normal range of motion. She exhibits no edema.  Capillary refill 1 second in legs bilaterally.  Neurological: She is alert and oriented to person, place, and time. No cranial nerve deficit. Coordination normal.  Skin: Skin is warm and dry. There is erythema.  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 Multiple lesions noted.   Psychiatric: She has a normal mood and affect. Her behavior is normal.  Nursing note and vitals reviewed.  ED Course  Procedures (including critical care time)  DIAGNOSTIC STUDIES: Oxygen Saturation is 100% on RA, normal by my interpretation.    COORDINATION OF CARE: 4:06 PM- Discussed plans to Advised patient to contact infectious disease doctor informing him of flare up episode. Pt advised of plan for treatment and pt agrees.  Labs Review Labs Reviewed - No data to display  Imaging Review No results found.   EKG Interpretation None     MDM   Final diagnoses:  Hydradenitis    HIV patient followed by infectious disease at [facility]. Patient with long-standing history of chronic infections to the bilateral groin area. Some components of his suggestive of hidradenitis. Patient with a lot of scarring multiple pustules in the area. No evidence of any deep abscesses today. Will treat with antibiotics and soaks and have her follow back up with her infectious disease doctors tomorrow.     I personally performed the services described in this documentation, which was  scribed in my presence. The recorded information has been reviewed and is accurate.    Vanetta Mulders, MD 03/25/15 1630

## 2016-02-05 ENCOUNTER — Encounter (HOSPITAL_COMMUNITY): Payer: Self-pay | Admitting: *Deleted

## 2016-02-05 ENCOUNTER — Emergency Department (HOSPITAL_COMMUNITY): Payer: Medicaid Other

## 2016-02-05 ENCOUNTER — Emergency Department (HOSPITAL_COMMUNITY)
Admission: EM | Admit: 2016-02-05 | Discharge: 2016-02-06 | Disposition: A | Discharge status: Home / Self Care | Payer: Medicaid Other | Attending: Emergency Medicine | Admitting: Emergency Medicine

## 2016-02-05 DIAGNOSIS — Z7982 Long term (current) use of aspirin: Secondary | ICD-10-CM | POA: Diagnosis not present

## 2016-02-05 DIAGNOSIS — B20 Human immunodeficiency virus [HIV] disease: Secondary | ICD-10-CM | POA: Insufficient documentation

## 2016-02-05 DIAGNOSIS — Z87891 Personal history of nicotine dependence: Secondary | ICD-10-CM | POA: Diagnosis not present

## 2016-02-05 DIAGNOSIS — Z792 Long term (current) use of antibiotics: Secondary | ICD-10-CM | POA: Insufficient documentation

## 2016-02-05 DIAGNOSIS — R51 Headache: Secondary | ICD-10-CM | POA: Diagnosis present

## 2016-02-05 DIAGNOSIS — R519 Headache, unspecified: Secondary | ICD-10-CM

## 2016-02-05 DIAGNOSIS — Z79899 Other long term (current) drug therapy: Secondary | ICD-10-CM | POA: Diagnosis not present

## 2016-02-05 DIAGNOSIS — I10 Essential (primary) hypertension: Secondary | ICD-10-CM | POA: Diagnosis not present

## 2016-02-05 LAB — I-STAT CG4 LACTIC ACID, ED: Lactic Acid, Venous: 1.45 mmol/L (ref 0.5–2.0)

## 2016-02-05 MED ORDER — METOCLOPRAMIDE HCL 5 MG/ML IJ SOLN
10.0000 mg | Freq: Once | INTRAMUSCULAR | Status: AC
  Administered 2016-02-05: 10 mg via INTRAVENOUS
  Filled 2016-02-05: qty 2

## 2016-02-05 MED ORDER — SODIUM CHLORIDE 0.9 % IV BOLUS (SEPSIS)
1000.0000 mL | Freq: Once | INTRAVENOUS | Status: AC
  Administered 2016-02-05: 1000 mL via INTRAVENOUS

## 2016-02-05 MED ORDER — DIPHENHYDRAMINE HCL 50 MG/ML IJ SOLN
25.0000 mg | Freq: Once | INTRAMUSCULAR | Status: AC
  Administered 2016-02-05: 25 mg via INTRAVENOUS
  Filled 2016-02-05: qty 1

## 2016-02-05 MED ORDER — BUTALBITAL-APAP-CAFFEINE 50-325-40 MG PO TABS: 1.0000 | ORAL_TABLET | Freq: Four times a day (QID) | ORAL | Status: AC | PRN

## 2016-02-05 MED ORDER — KETOROLAC TROMETHAMINE 30 MG/ML IJ SOLN
30.0000 mg | Freq: Once | INTRAMUSCULAR | Status: AC
  Administered 2016-02-05: 30 mg via INTRAVENOUS
  Filled 2016-02-05: qty 1

## 2016-02-05 NOTE — ED Notes (Signed)
Pt is here with left lateral head pain that she describes as aching for a couple of days.  Pt states she never gets headaches. Pt states it is sharp pain.  No neuro deficits and pupils 3RRB

## 2016-02-05 NOTE — ED Notes (Signed)
Patient ambulatory to the bathroom at this time with no difficulty or distress; visitor at bedside

## 2016-02-05 NOTE — ED Notes (Signed)
Pt verbalizes understanding of instructions. 

## 2016-02-05 NOTE — ED Provider Notes (Signed)
CSN: [id number]     Arrival date & time 02/05/16  4098 History   First MD Initiated Contact with Patient 02/05/16 1338     Chief Complaint  Patient presents with  . Headache     (Consider location/radiation/quality/duration/timing/severity/associated sxs/prior Treatment) HPI  55 year old female with a history of HTN and HIV presents with a 2 day history of left sided headache. Patient states the headache has been waxing and waning. Seemed to get better with ibuprofen. No nausea, vomiting, blurry vision, photophobia, or neck pain/stiffness. Headache is left-sided occipital. Feels like an aching. No fevers. Patient has a history of HIV, she does not know her CD4 count. She states her viral load is undetectable. No weakness or numbness.  Past Medical History  Diagnosis Date  . Hypertension   . HIV (human immunodeficiency virus infection) [facility])    Past Surgical History  Procedure Laterality Date  . Tubal ligation     No family history on file. Social History  Substance Use Topics  . Smoking status: Former Games developer  . Smokeless tobacco: None  . Alcohol Use: Yes   OB History    No data available     Review of Systems  Constitutional: Negative for fever.  Eyes: Negative for photophobia and visual disturbance.  Gastrointestinal: Negative for nausea and vomiting.  Musculoskeletal: Negative for neck pain and neck stiffness.  Neurological: Positive for headaches. Negative for dizziness, weakness and numbness.  All other systems reviewed and are negative.     Allergies  Review of patient's allergies indicates no known allergies.  Home Medications   Prior to Admission medications   Medication Sig Start Date End Date Taking? Authorizing Provider  aspirin 81 MG tablet Take 81 mg by mouth daily.    Historical Provider, MD  doxycycline (VIBRAMYCIN) 100 MG capsule Take 1 capsule (100 mg total) by mouth 2 (two) times daily. 03/25/15   Vanetta Mulders, MD  Emtricitabine-Tenofovir  (TRUVADA PO) Take by mouth.    Historical Provider, MD  hydrochlorothiazide (MICROZIDE) 12.5 MG capsule Take 12.5 mg by mouth daily.    Historical Provider, MD  HYDROcodone-acetaminophen (NORCO/VICODIN) 5-325 MG per tablet Take 1-2 tablets by mouth every 6 (six) hours as needed for moderate pain. 03/25/15   Vanetta Mulders, MD  LISINOPRIL PO Take by mouth.    Historical Provider, MD  Sertraline HCl (ZOLOFT PO) Take by mouth.    Historical Provider, MD  simvastatin (ZOCOR) 10 MG tablet Take 10 mg by mouth at bedtime.    Historical Provider, MD  Ziprasidone HCl (GEODON PO) Take by mouth.    Historical Provider, MD   BP 144/72 mmHg  Pulse 62  Temp(Src) 98.3 F (36.8 C) (Oral)  Resp 18  Ht  (1.6 m)  Wt 239 lb 3.2 oz (108.5 kg)  BMI 42.38 kg/m2  SpO2 100% Physical Exam  Constitutional: She is oriented to person, place, and time. She appears well-developed and well-nourished. No distress.  HENT:  Head: Normocephalic and atraumatic.  Right Ear: External ear normal.  Left Ear: External ear normal.  Nose: Nose normal.  Eyes: EOM are normal. Pupils are equal, round, and reactive to light. Right eye exhibits no discharge. Left eye exhibits no discharge.  Neck: Normal range of motion. Neck supple.  Cardiovascular: Normal rate, regular rhythm and normal heart sounds.   Pulmonary/Chest: Effort normal and breath sounds normal.  Abdominal: Soft. There is no tenderness.  Neurological: She is alert and oriented to person, place, and time.  CN  2-12 grossly intact. 5/5 strength in all 4 extremities. Grossly normal sensation. Normal finger to nose  Skin: Skin is warm and dry. She is not diaphoretic.  Nursing note and vitals reviewed.   ED Course  Procedures (including critical care time) Labs Review Labs Reviewed - No data to display  Imaging Review Ct Head Wo Contrast  02/05/2016  CLINICAL DATA:  Left-sided headaches for few days. HIV. Hypertension. EXAM: CT HEAD WITHOUT CONTRAST TECHNIQUE:  Contiguous axial images were obtained from the base of the skull through the vertex without intravenous contrast. COMPARISON:  11/21/2014 FINDINGS: Sinuses/Soft tissues: Clear paranasal sinuses and mastoid air cells. Intracranial: Carotid siphon atherosclerosis. No mass lesion, hemorrhage, hydrocephalus, acute infarct, intra-axial, or extra-axial fluid collection. IMPRESSION: 1.  No acute intracranial abnormality. 2. Intracranial atherosclerosis. Electronically Signed   By: Jeronimo GreavesKyle  Talbot M.D.   On: 02/05/2016 14:39   I have personally reviewed and evaluated these images and lab results as part of my medical decision-making.   EKG Interpretation None      MDM   Final diagnoses:  Occipital headache    Patient with waxing and waning headache. Neuro exam normal, no stiffness or meningismus. CT head negative. [facility]. Discussed needing LP to rule this out but patient does not want this. Doubt meningitis. Unknown CD4 but per her her counts have been normal and thus I do not feel LP needed for infectious cause. HA gone after treatment. Likely benign headache, patient will go home, f/u with PCP and discussed strict return precautions.    Pricilla LovelessScott Charise Leinbach, MD 02/05/16 [phone]

## 2017-09-08 IMAGING — CT CT HEAD W/O CM
2 series · 16 of 30 positions shown, 20 images · non-contrast
Comparison: 11/21/2014

CLINICAL DATA: Left-sided headaches for few days. HIV.
Hypertension.

EXAM:
CT HEAD WITHOUT CONTRAST
TECHNIQUE: Contiguous axial images were obtained from the base of the skull
through the vertex without intravenous contrast.

[Series 201: head w/o, idose (1) · axial · non-contrast · 0.44mm/px · z∈[+67,+192]mm · 13 of 31 slices shown, 17 images]
[im 3/31  brain]
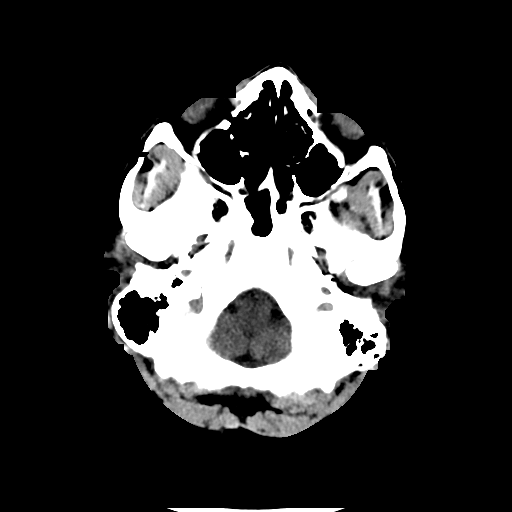
[im 3/31  bone]
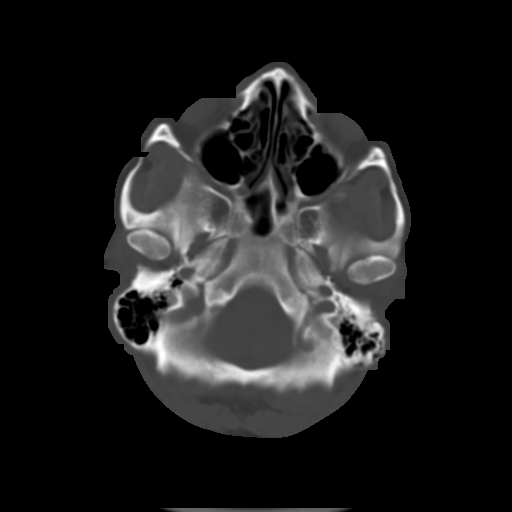
[im 5/31  brain]
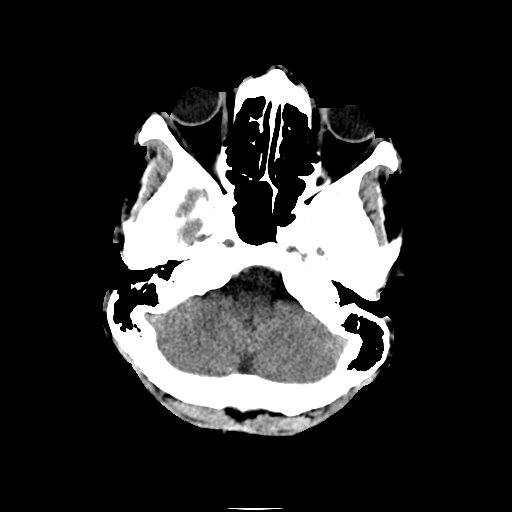
[im 7/31  brain]
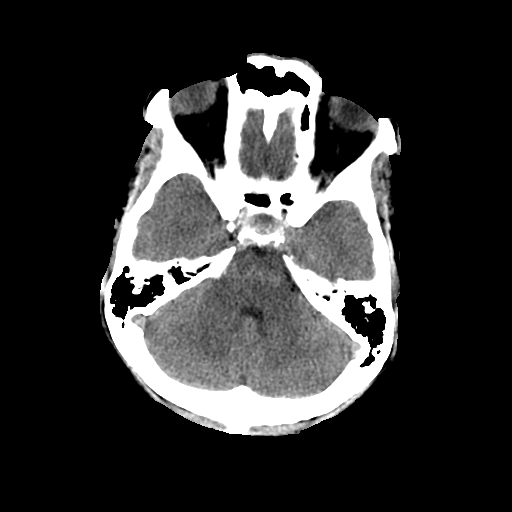
[im 9/31  brain]
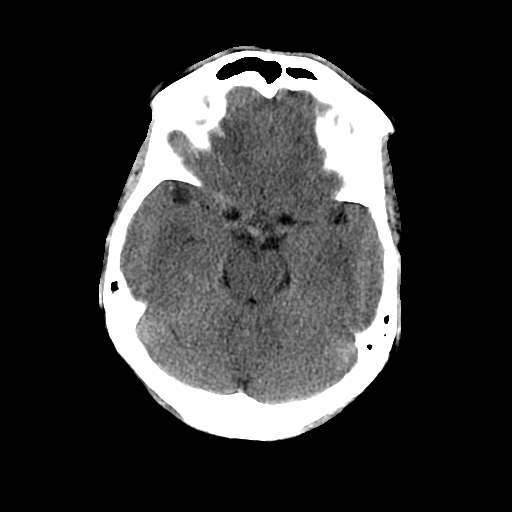
[im 11/31  brain]
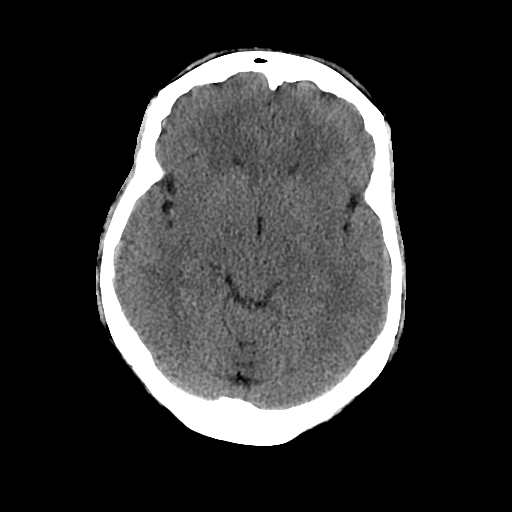
[im 11/31  bone]
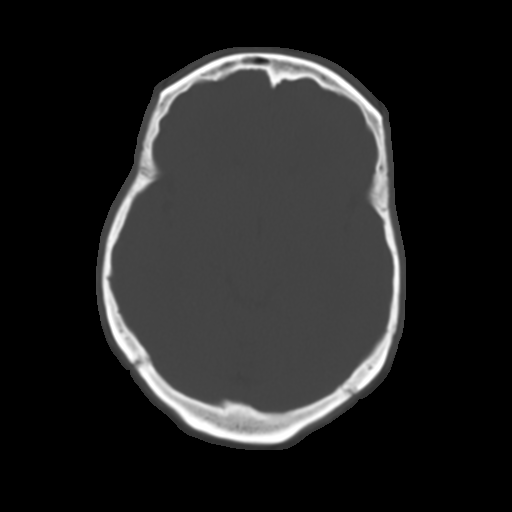
[im 13/31  brain]
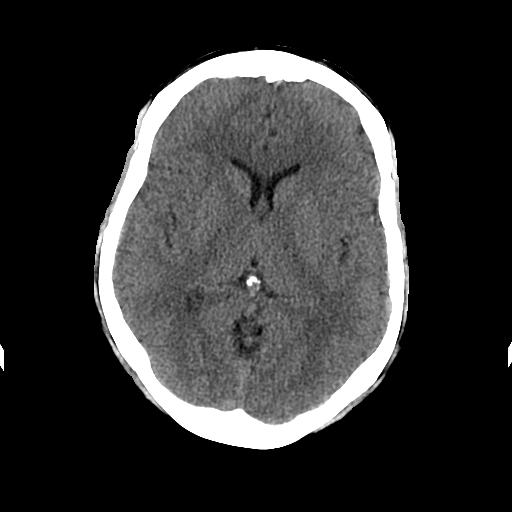
[im 16/31  brain]
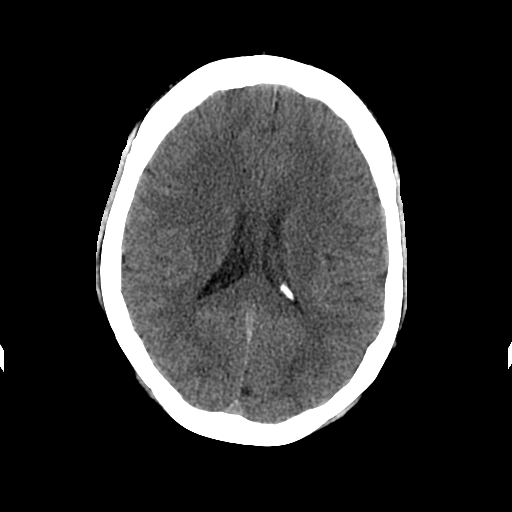
[im 18/31  brain]
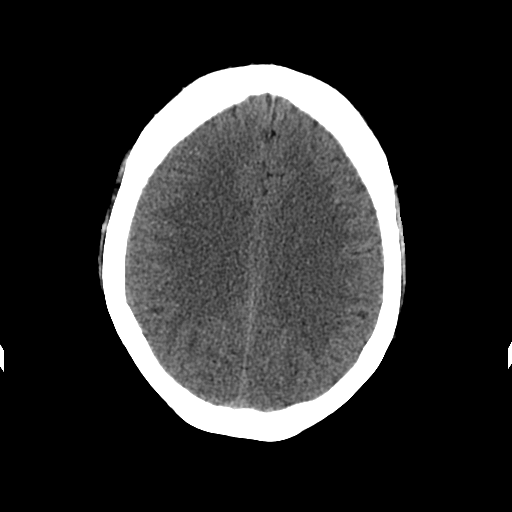
[im 20/31  brain]
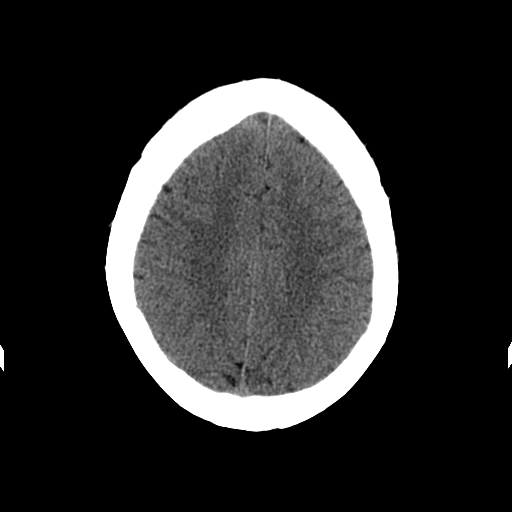
[im 20/31  bone]
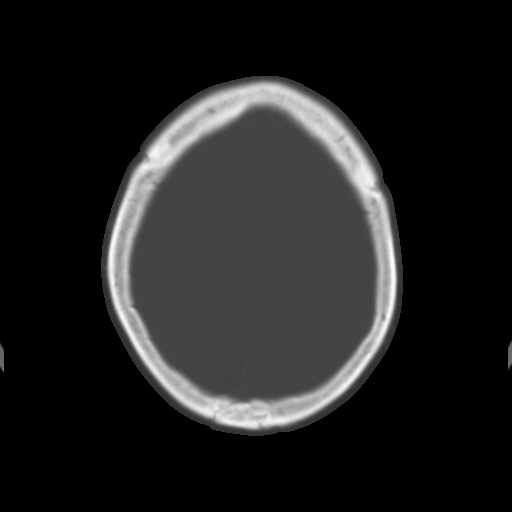
[im 22/31  brain]
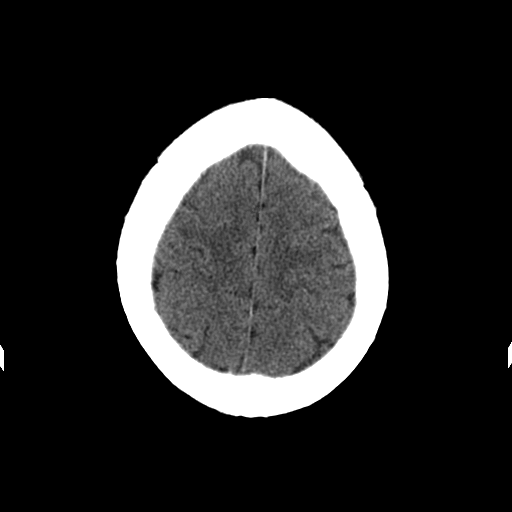
[im 24/31  brain]
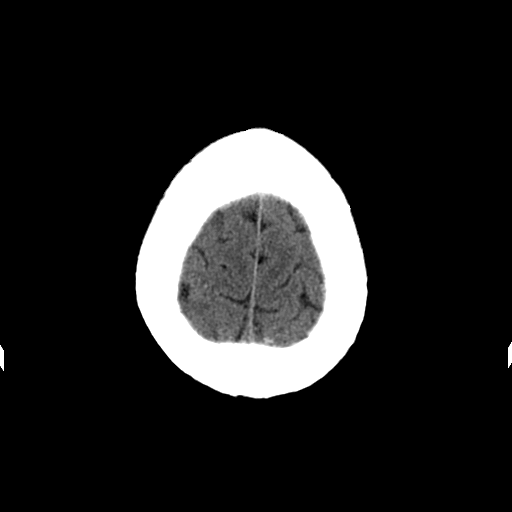
[im 26/31  brain]
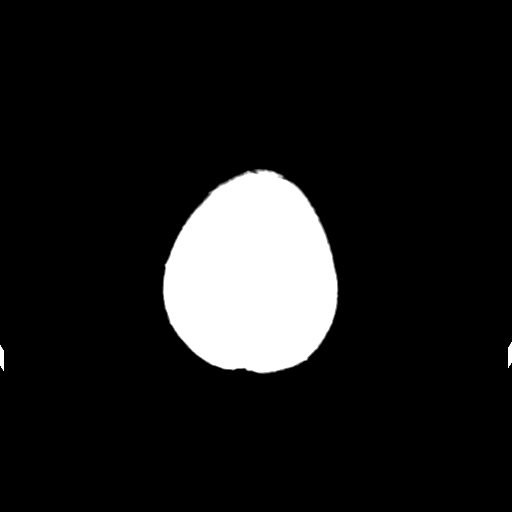
[im 28/31  brain]
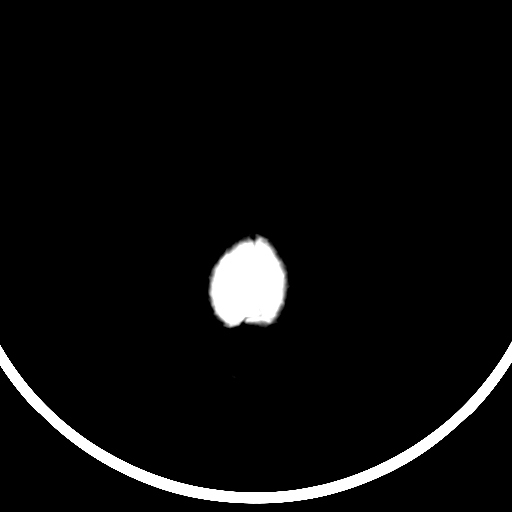
[im 28/31  bone]
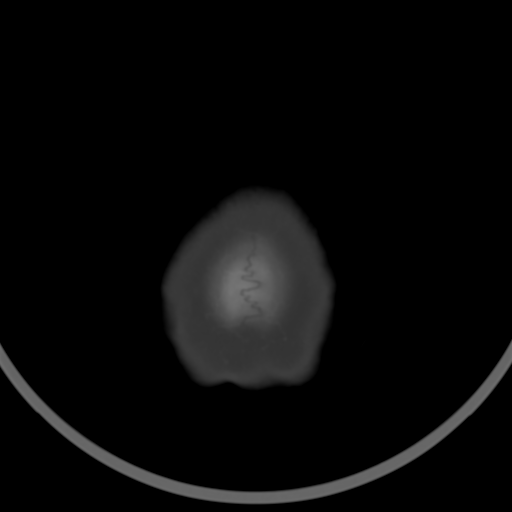

[Series 202: head w/o bone, idose (1) · axial · non-contrast · 0.44mm/px · z∈[+67,+107]mm · 3 of 31 slices shown]
[im 3/31  bone]
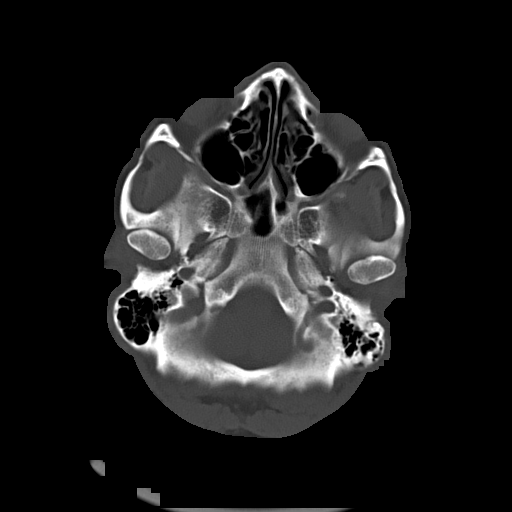
[im 7/31  bone]
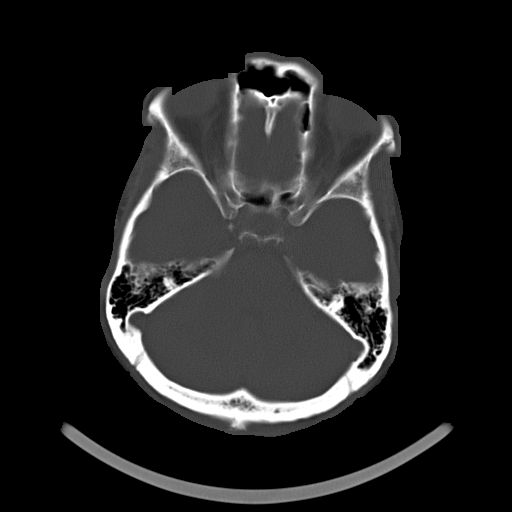
[im 11/31  bone]
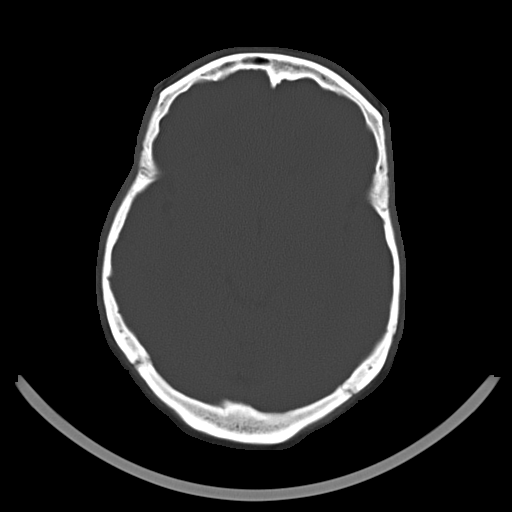

[16 of 30 positions shown; findings below may reference images not displayed]

FINDINGS: Sinuses/Soft tissues: Clear paranasal sinuses and mastoid air cells.

Intracranial: Carotid siphon atherosclerosis. No mass lesion,
hemorrhage, hydrocephalus, acute infarct, intra-axial, or
extra-axial fluid collection.
IMPRESSION: 1.  No acute intracranial abnormality.
2. Intracranial atherosclerosis.

## 2018-07-16 ENCOUNTER — Encounter (HOSPITAL_COMMUNITY): Payer: Self-pay

## 2018-07-16 ENCOUNTER — Emergency Department (HOSPITAL_COMMUNITY)
Admission: EM | Admit: 2018-07-16 | Discharge: 2018-07-16 | Disposition: A | Discharge status: Home / Self Care | Payer: Medicaid Other | Attending: Emergency Medicine | Admitting: Emergency Medicine

## 2018-07-16 ENCOUNTER — Emergency Department (HOSPITAL_COMMUNITY): Payer: Medicaid Other

## 2018-07-16 DIAGNOSIS — I10 Essential (primary) hypertension: Secondary | ICD-10-CM | POA: Diagnosis not present

## 2018-07-16 DIAGNOSIS — Z87891 Personal history of nicotine dependence: Secondary | ICD-10-CM | POA: Diagnosis not present

## 2018-07-16 DIAGNOSIS — R079 Chest pain, unspecified: Secondary | ICD-10-CM | POA: Diagnosis present

## 2018-07-16 DIAGNOSIS — Z7982 Long term (current) use of aspirin: Secondary | ICD-10-CM | POA: Insufficient documentation

## 2018-07-16 DIAGNOSIS — Z79899 Other long term (current) drug therapy: Secondary | ICD-10-CM | POA: Diagnosis not present

## 2018-07-16 DIAGNOSIS — R0789 Other chest pain: Secondary | ICD-10-CM | POA: Diagnosis not present

## 2018-07-16 LAB — CBC
HCT: 36.9 % (ref 36.0–46.0)
Hemoglobin: 11.5 g/dL — ABNORMAL LOW (ref 12.0–15.0)
MCH: 28.5 pg (ref 26.0–34.0)
MCHC: 31.2 g/dL (ref 30.0–36.0)
MCV: 91.3 fL (ref 78.0–100.0)
PLATELETS: 495 10*3/uL — AB (ref 150–400)
RBC: 4.04 MIL/uL (ref 3.87–5.11)
RDW: 15.2 % (ref 11.5–15.5)
WBC: 8.3 10*3/uL (ref 4.0–10.5)

## 2018-07-16 LAB — BASIC METABOLIC PANEL
ANION GAP: 9 (ref 5–15)
BUN: 11 mg/dL (ref 6–20)
CALCIUM: 9.4 mg/dL (ref 8.9–10.3)
CO2: 29 mmol/L (ref 22–32)
CREATININE: 0.74 mg/dL (ref 0.44–1.00)
Chloride: 102 mmol/L (ref 98–111)
GFR calc Af Amer: >60 mL/min (ref >60)
Glucose, Bld: 105 mg/dL — ABNORMAL HIGH (ref 70–99)
Potassium: 3.9 mmol/L (ref 3.5–5.1)
SODIUM: 140 mmol/L (ref 135–145)

## 2018-07-16 LAB — I-STAT TROPONIN, ED: TROPONIN I, POC: 0.01 ng/mL (ref 0.00–0.08)

## 2018-07-16 MED ORDER — IBUPROFEN 400 MG PO TABS
600.0000 mg | ORAL_TABLET | Freq: Once | ORAL | Status: AC
  Administered 2018-07-16: 600 mg via ORAL
  Filled 2018-07-16: qty 1

## 2018-07-16 NOTE — ED Provider Notes (Signed)
Patient placed in Quick Look pathway, seen and evaluated   Chief Complaint: Right anterior chest pain  HPI:   Patient reports right anterior chest pain.  She reports a white productive cough.  Chest pain has been going on for days.  Her pain feels like "Gas or a cold up in my chest."    ROS: No shortness of breath.  Chest pain is intermittent.    Physical Exam:   Gen: No distress  Neuro: Awake and Alert  Skin: Warm    Focused Exam: Patient is very anxious, crying from anxiety.    Initiation of care has begun. The patient has been counseled on the process, plan, and necessity for staying for the completion/evaluation, and the remainder of the medical screening examination    Norman ClayHammond, Hanif Radin W, PA-C 07/16/18 1354    Sabas SousBero, Michael M, MD 07/16/18 [phone]

## 2018-07-16 NOTE — Discharge Instructions (Addendum)
Your EKG, lab work and chest x-ray look good today. There are no signs that you are having a heart attack or have pneumonia.   Most of the pain you have is coming from muscles in your chest wall. To help relieve the pain, you may use warm compresses to the area with Tylenol and Ibuprofen. Please take it easy with lifting over the next couple of days.  Thank you for allowing me to take care of you today!

## 2018-07-16 NOTE — ED Triage Notes (Signed)
Pt presents with 2 day h/o R sided chest pain that does not radiate.  Pt denies any shortness of breath or nausea; reports productive cough with white phlegm x 3-4 days.

## 2018-07-16 NOTE — ED Provider Notes (Signed)
[facility] EMERGENCY DEPARTMENT Provider Note  CSN: [id number] Arrival date & time: 07/16/18  1346    History   Chief Complaint Chief Complaint  Patient presents with  . Chest Pain    HPI Christine Ward is a 57 y.o. female with a medical history of HIV and HTN who presented to the ED for right sided chest pain x2 days. She describes an achy pain that is worse with palpation. She cannot recall any specific injuries or trauma, but reports lifting heavy items at home. Denies fever, fatigue, SOB, dyspnea, orthopnea, leg swelling, palpitations, cough, upper respiratory symptoms, abdominal pain, N/V or diaphoresis. Patient has tried nothing prior to coming to the ED.  Past Medical History:  Diagnosis Date  . HIV (human immunodeficiency virus infection) (HCC)   . Hypertension     There are no active problems to display for this patient.   Past Surgical History:  Procedure Laterality Date  . TUBAL LIGATION       OB History   None      Home Medications    Prior to Admission medications   Medication Sig Start Date End Date Taking? Authorizing Provider  aspirin 81 MG tablet Take 81 mg by mouth daily.   Yes [provider]  Aspirin-Acetaminophen-Caffeine (GOODY HEADACHE PO) Take 1 packet by mouth every 6 (six) hours as needed (for headaches or pain).   Yes [provider]  DESCOVY 200-25 MG tablet Take 1 tablet by mouth daily. 07/02/18  Yes [provider]  hydrochlorothiazide (HYDRODIURIL) 25 MG tablet Take 25 mg by mouth daily.   Yes [provider]  ibuprofen (ADVIL,MOTRIN) 200 MG tablet Take 200-600 mg by mouth every 6 (six) hours as needed (for pain or headaches).   Yes [provider]  lisinopril (PRINIVIL,ZESTRIL) 10 MG tablet Take 10 mg by mouth daily.   Yes [provider]  simvastatin (ZOCOR) 20 MG tablet Take 20 mg by mouth daily.   Yes [provider]  TIVICAY 50 MG tablet Take 50 mg by  mouth daily.  07/02/18  Yes [provider]  valACYclovir (VALTREX) 500 MG tablet Take 500 mg by mouth 2 (two) times daily. 06/07/18  Yes [provider]  doxycycline (VIBRAMYCIN) 100 MG capsule Take 1 capsule (100 mg total) by mouth 2 (two) times daily. Patient not taking: Reported on 07/16/2018 03/25/15   Vanetta Mulders, MD  HYDROcodone-acetaminophen (NORCO/VICODIN) 5-325 MG per tablet Take 1-2 tablets by mouth every 6 (six) hours as needed for moderate pain. Patient not taking: Reported on 07/16/2018 03/25/15   Vanetta Mulders, MD    Family History History reviewed. No pertinent family history.  Social History Social History   Tobacco Use  . Smoking status: Former Games developer  . Smokeless tobacco: Never Used  Substance Use Topics  . Alcohol use: Yes  . Drug use: No     Allergies   Patient has no known allergies.   Review of Systems Review of Systems  Constitutional: Negative for chills, fatigue and fever.  HENT: Negative for congestion, postnasal drip, rhinorrhea, sinus pain and sore throat.   Eyes: Negative.   Respiratory: Negative for cough, chest tightness and shortness of breath.   Cardiovascular: Positive for chest pain. Negative for palpitations and leg swelling.  Gastrointestinal: Negative for abdominal pain, diarrhea and vomiting.  Genitourinary: Negative.   Musculoskeletal: Negative.   Skin: Negative.   Allergic/Immunologic: Positive for immunocompromised state.  Neurological: Negative.   Hematological: Negative.  Physical Exam Updated Vital Signs BP 130/76 (BP Location: Right Arm)   Pulse 77   Temp 98.6 F (37 C) (Oral)   Resp 20   Ht 5' 2.5" (1.588 m)   Wt 99.8 kg   SpO2 100%   BMI 39.60 kg/m   Physical Exam  Constitutional: Vital signs are normal. She is cooperative.  Non-toxic appearance. She does not have a sickly appearance.  HENT:  Head: Atraumatic.  Mouth/Throat: Uvula is midline, oropharynx is clear and moist and mucous  membranes are normal.  Eyes: Pupils are equal, round, and reactive to light. Conjunctivae, EOM and lids are normal.  Neck: Trachea normal, normal range of motion, full passive range of motion without pain and phonation normal. Neck supple. Normal carotid pulses and no JVD present. Carotid bruit is not present.  Cardiovascular: Normal rate, regular rhythm, normal heart sounds, intact distal pulses and normal pulses.  No murmur heard. Pulmonary/Chest: Effort normal and breath sounds normal. She exhibits tenderness.  Right side chest wall tenderness at rib 4-5. No crepitus, deformity or bony tenderness.    Abdominal: Soft. Normal appearance and bowel sounds are normal. There is no tenderness.  Neurological: She is alert.  Skin: Skin is warm and intact. No rash noted.  Nursing note and vitals reviewed.  ED Treatments / Results  Labs (all labs ordered are listed, but only abnormal results are displayed) Labs Reviewed  BASIC METABOLIC PANEL - Abnormal; Notable for the following components:      Result Value   Glucose, Bld 105 (*)    All other components within normal limits  CBC - Abnormal; Notable for the following components:   Hemoglobin 11.5 (*)    Platelets 495 (*)    All other components within normal limits  I-STAT TROPONIN, ED    EKG None  Radiology Dg Chest 2 View  Result Date: 07/16/2018 CLINICAL DATA:  Right-sided chest pain for 2 days. Productive cough. HIV. EXAM: CHEST - 2 VIEW COMPARISON:  None. FINDINGS: The heart size is normal. Linear airspace disease is present at both lung bases. Lung volumes are low. No other significant airspace disease is present. Mild pulmonary vascular congestion is present. There is no pneumothorax. The visualized soft tissues and bony thorax are unremarkable. IMPRESSION: 1. Low lung volumes and bibasilar airspace disease, likely reflecting atelectasis. Atypical infection in this patient with HIV is not excluded. Electronically Signed   By:  Marin Robertshristopher  Mattern M.D.   On: 07/16/2018 15:54    Procedures Procedures (including critical care time)  Medications Ordered in ED Medications  ibuprofen (ADVIL,MOTRIN) tablet 600 mg (600 mg Oral Given 07/16/18 1643)     Initial Impression / Assessment and Plan / ED Course  Triage vital signs and the nursing notes have been reviewed.  Pertinent labs & imaging results that were available during care of the patient were reviewed and considered in medical decision making (see chart for details).  Patient presents with normal vital signs and not in acute distress for reproducible right sided chest pain x2 days. Physical exam only significant for muscular tenderness on right side of chest at ribs 4-5. No abnormalities appreciated with auscultation of heart and lungs. HEART score of 2 and history consistent with MSK etiology to pain. Will continue chest pain work-up to thoroughly assess for acute cardiac or pulmonary process.  Clinical Course as of Jul 16 1652  Fri Jul 16, 2018  1607 EKG showed NSR. No ST elevations/depressions or signs of acute ischemia or infarct. This  is reassuring in combination with negative troponin which assists in evaluating and ruling out an acute cardiac process.   [GM]  1626 CXR showed low lung volumes bilaterally. Patient admits to long smoking history. Also possible atypical infection seen; however, this does not correlate with physical exam. Patient has no respiratory complaints including SOB, dyspnea or cough. On physical exam, patient is not in respiratory distress, no abnormal lung sounds heard in any lung fields. She has been able to maintain oxygen saturations > 98% throughout visit.   [GM]    Clinical Course User Index [GM] Mortis, Sharyon Medicus, PA-C    Final Clinical Impressions(s) / ED Diagnoses  1. Chest Wall Pain. Likely MSK etiology. Education provided on OTC and supportive treatment options for pain relief. Advised to follow-up with PCP. Education  provided on cardiac and pulmonary s/s that warrant return to the ED vs PCP.  Dispo: Home. After thorough clinical evaluation, this patient is determined to be medically stable and can be safely discharged with the previously mentioned treatment and/or outpatient follow-up/referral(s). At this time, there are no other apparent medical conditions that require further screening, evaluation or treatment.   Final diagnoses:  Chest wall pain    ED Discharge Orders    None        Windy Carina, [place] 07/16/18 1653    Vanetta Mulders, MD 07/25/18 1725

## 2020-02-17 IMAGING — DX DG CHEST 2V
2 series · 2 of 2 positions shown · non-contrast
Comparison: None.

CLINICAL DATA: Right-sided chest pain for 2 days. Productive cough.
HIV.

EXAM:
CHEST - 2 VIEW

[chest lat]
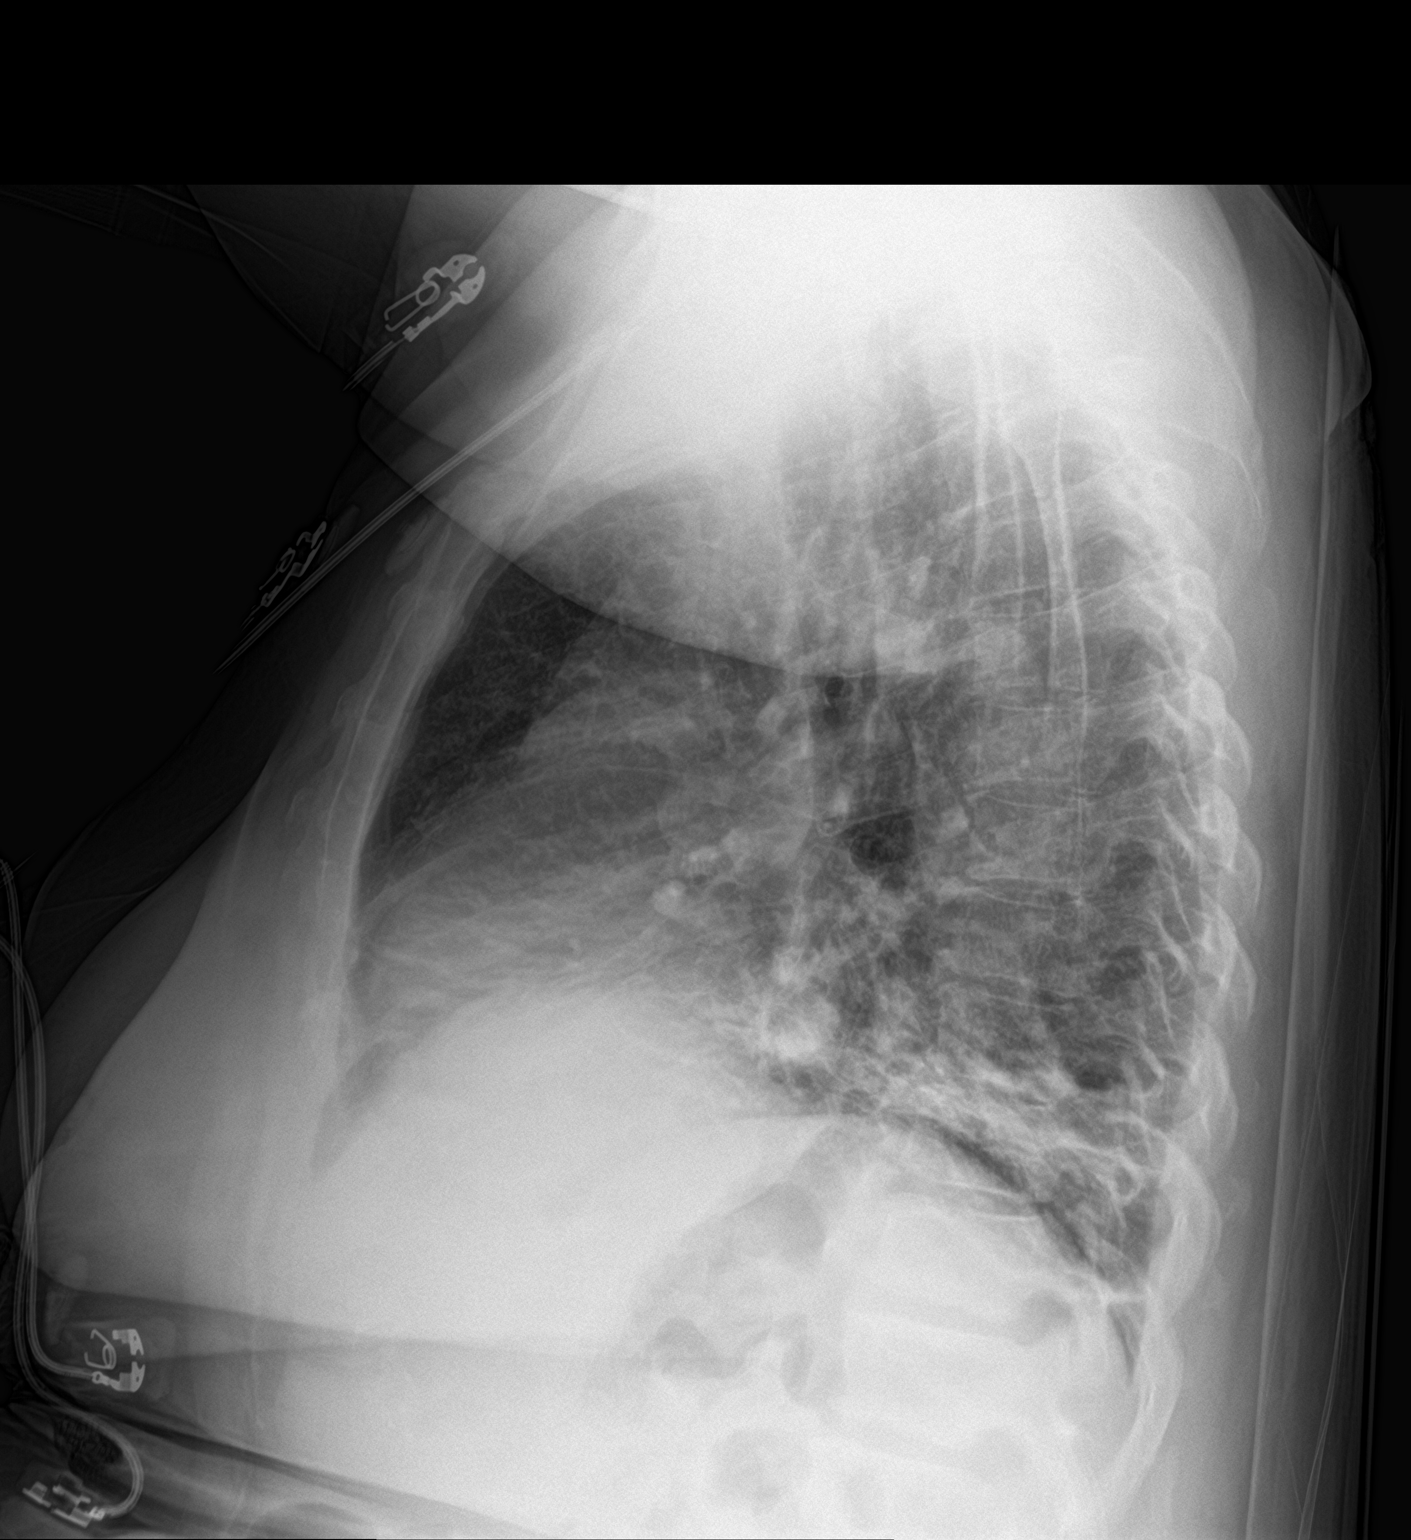

[chest ap]
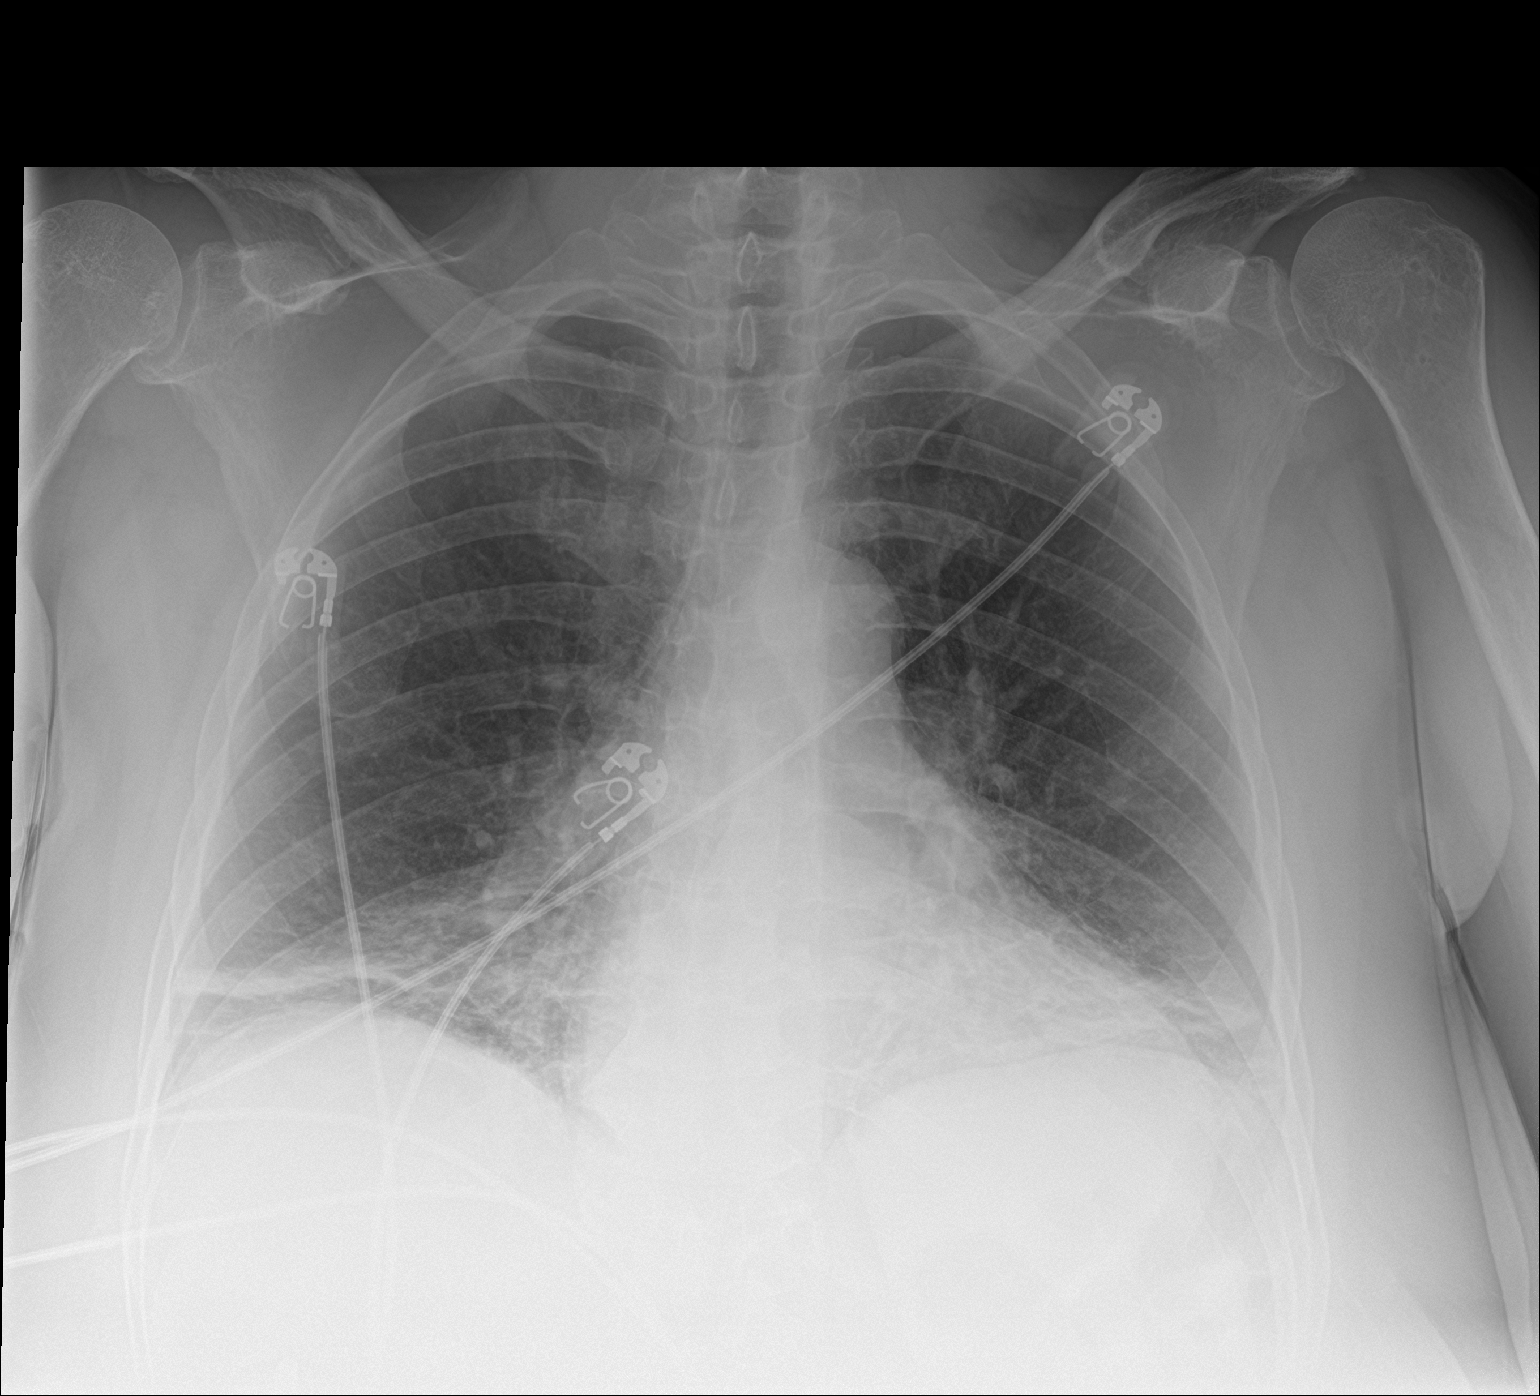

[2 of 2 positions shown; findings below may reference images not displayed]

FINDINGS: The heart size is normal. Linear airspace disease is present at both
lung bases. Lung volumes are low. No other significant airspace
disease is present. Mild pulmonary vascular congestion is present.
There is no pneumothorax. The visualized soft tissues and bony
thorax are unremarkable.
IMPRESSION: 1. Low lung volumes and bibasilar airspace disease, likely
reflecting atelectasis. Atypical infection in this patient with HIV
is not excluded.

## 2020-04-05 ENCOUNTER — Ambulatory Visit: Payer: Medicaid Other | Attending: Internal Medicine

## 2020-04-05 DIAGNOSIS — Z23 Encounter for immunization: Secondary | ICD-10-CM

## 2020-04-05 NOTE — Progress Notes (Signed)
   Covid-19 Vaccination Clinic  Name:  Christine Ward    MRN: [id number] DOB: Apr 20, 1961  04/05/2020  [name] was observed post Covid-19 immunization for 15 minutes without incident. She was provided with Vaccine Information Sheet and instruction to access the V-Safe system.   [name] was instructed to call 911 with any severe reactions post vaccine: Marland Kitchen Difficulty breathing  . Swelling of face and throat  . A fast heartbeat  . A bad rash all over body  . Dizziness and weakness   Immunizations Administered    Name Date Dose VIS Date Route   Pfizer COVID-19 Vaccine 04/05/2020  9:08 AM 0.3 mL 01/18/2019 Intramuscular   Manufacturer: [employer], [employer]   Lot: [id number]   NDC: [address]-3

## 2020-04-30 ENCOUNTER — Ambulatory Visit: Payer: Medicaid Other | Attending: Internal Medicine

## 2020-04-30 DIAGNOSIS — Z23 Encounter for immunization: Secondary | ICD-10-CM

## 2020-04-30 NOTE — Progress Notes (Signed)
   Covid-19 Vaccination Clinic  Name:  Christine Ward    MRN: [id number] DOB: 1961/06/26  04/30/2020  [name] was observed post Covid-19 immunization for 15 minutes without incident. She was provided with Vaccine Information Sheet and instruction to access the V-Safe system.   [name] was instructed to call 911 with any severe reactions post vaccine: Marland Kitchen Difficulty breathing  . Swelling of face and throat  . A fast heartbeat  . A bad rash all over body  . Dizziness and weakness   Immunizations Administered    Name Date Dose VIS Date Route   Pfizer COVID-19 Vaccine 04/30/2020  9:16 AM 0.3 mL 01/18/2019 Intramuscular   Manufacturer: [employer], [employer]   Lot: [id number]   NDC: [address]-1

## 2022-03-27 ENCOUNTER — Encounter (HOSPITAL_COMMUNITY): Payer: Self-pay

## 2022-03-27 ENCOUNTER — Ambulatory Visit (HOSPITAL_COMMUNITY)
Admission: EM | Admit: 2022-03-27 | Discharge: 2022-03-27 | Disposition: A | Discharge status: Home / Self Care | Payer: Medicaid Other | Attending: Internal Medicine | Admitting: Internal Medicine

## 2022-03-27 DIAGNOSIS — T7840XA Allergy, unspecified, initial encounter: Secondary | ICD-10-CM

## 2022-03-27 DIAGNOSIS — T783XXA Angioneurotic edema, initial encounter: Secondary | ICD-10-CM

## 2022-03-27 MED ORDER — FAMOTIDINE 20 MG PO TABS: 20.0000 mg | ORAL_TABLET | Freq: Two times a day (BID) | ORAL | 0 refills | Status: AC

## 2022-03-27 MED ORDER — METHYLPREDNISOLONE SODIUM SUCC 125 MG IJ SOLR
125.0000 mg | Freq: Once | INTRAMUSCULAR | Status: AC
  Administered 2022-03-27: 125 mg via INTRAMUSCULAR

## 2022-03-27 MED ORDER — PREDNISONE 20 MG PO TABS: 40.0000 mg | ORAL_TABLET | Freq: Every day | ORAL | 0 refills | Status: AC

## 2022-03-27 MED ORDER — CETIRIZINE HCL 10 MG PO TABS: 10.0000 mg | ORAL_TABLET | Freq: Every day | ORAL | 1 refills | Status: AC

## 2022-03-27 MED ORDER — METHYLPREDNISOLONE SODIUM SUCC 125 MG IJ SOLR
INTRAMUSCULAR | Status: AC
  Filled 2022-03-27: qty 2

## 2022-03-27 NOTE — ED Provider Notes (Signed)
?[facility] ? ? ? ?CSN: [id number] ?Arrival date & time: 03/27/22  1050 ? ? ?  ? ?History   ?Chief Complaint ?No chief complaint on file. ? ? ?HPI ?Christine Ward is a 61 y.o. female.  ? ?Patient here for evaluation of allergic reaction that started Tuesday May 2nd. She ate 2 bites of a [employer] sandwich. She developed lip swelling that night that became worse Wednesday morning. She has not have throat swelling or shortness of breath. States she has felt her heart beating fast, but she attributes this to nerves due to the allergic reaction.  She attempted to reduce the lip swelling with warm compresses with minimal relief. She went to the store to get Benadryl and took 50mg  of Benadryl at 4pm and at 9pm. She also took 50mg  this morning prior to coming to urgent care at 0800. She says the benadryl has helped with the swelling and that her lips "were double the size they are right now" before the benadryl.  She denies allergies that she knows of other than bee stings. She also states that when she was younger, her mouth would "swell up" randomly. She denies any allergies to food, changes to personal hygiene products, laundry detergent, and lip care.  Denies past medical problems, but states she takes hydrocortisone, lisinopril, simvastatin, HIV medication, and another medication that starts with "tri" that she doesn't remember. Denies recent changes to medications. Denies any other aggravating or relieving factors.  ? ? ? ?Past Medical History:  ?Diagnosis Date  ? HIV (human immunodeficiency virus infection) (HCC)   ? Hypertension   ? ? ?There are no problems to display for this patient. ? ? ?Past Surgical History:  ?Procedure Laterality Date  ? TUBAL LIGATION    ? ? ?OB History   ?No obstetric history on file. ?  ? ? ? ?Home Medications   ? ?Prior to Admission medications   ?Medication Sig Start Date End Date Taking? Authorizing Provider  ?cetirizine (ZYRTEC) 10 MG tablet Take 1 tablet (10 mg total) by  mouth daily. 03/27/22  Yes , FNP  ?famotidine (PEPCID) 20 MG tablet Take 1 tablet (20 mg total) by mouth 2 (two) times daily. 03/27/22  Yes Carlisle Beers, FNP  ?predniSONE (DELTASONE) 20 MG tablet Take 2 tablets (40 mg total) by mouth daily for 5 days. 03/27/22 04/01/22 Yes 05/27/22, FNP  ?aspirin 81 MG tablet Take 81 mg by mouth daily.    [provider]  ?Aspirin-Acetaminophen-Caffeine (GOODY HEADACHE PO) Take 1 packet by mouth every 6 (six) hours as needed (for headaches or pain).    [provider]  ?DESCOVY 200-25 MG tablet Take 1 tablet by mouth daily. 07/02/18   [provider]  ?doxycycline (VIBRAMYCIN) 100 MG capsule Take 1 capsule (100 mg total) by mouth 2 (two) times daily. ?Patient not taking: Reported on 07/16/2018 03/25/15   07/18/2018, MD  ?hydrochlorothiazide (HYDRODIURIL) 25 MG tablet Take 25 mg by mouth daily.    [provider]  ?HYDROcodone-acetaminophen (NORCO/VICODIN) 5-325 MG per tablet Take 1-2 tablets by mouth every 6 (six) hours as needed for moderate pain. ?Patient not taking: Reported on 07/16/2018 03/25/15   07/18/2018, MD  ?ibuprofen (ADVIL,MOTRIN) 200 MG tablet Take 200-600 mg by mouth every 6 (six) hours as needed (for pain or headaches).    [provider]  ?lisinopril (PRINIVIL,ZESTRIL) 10 MG tablet Take 10 mg by mouth daily.    [provider]  ?simvastatin (  ZOCOR) 20 MG tablet Take 20 mg by mouth daily.    [provider]  ?TIVICAY 50 MG tablet Take 50 mg by mouth daily.  07/02/18   [provider]  ?valACYclovir (VALTREX) 500 MG tablet Take 500 mg by mouth 2 (two) times daily. 06/07/18   [provider]  ? ? ?Family History ?History reviewed. No pertinent family history. ? ?Social History ?Social History  ? ?Tobacco Use  ? Smokeless tobacco: Never  ?Substance Use Topics  ? Alcohol use: Yes  ? Drug use: No  ? ? ? ?Allergies   ?Ace inhibitors ? ? ?Review of  Systems ?Review of Systems ?Per HPI ? ?Physical Exam ?Triage Vital Signs ?ED Triage Vitals  ?Enc Vitals Group  ?   BP   ?   Pulse   ?   Resp   ?   Temp   ?   Temp src   ?   SpO2   ?   Weight   ?   Height   ?   Head Circumference   ?   Peak Flow   ?   Pain Score   ?   Pain Loc   ?   Pain Edu?   ?   Excl. in GC?   ? ?No data found. ? ?Updated Vital Signs ?There were no vitals taken for this visit. ? ?Visual Acuity ?Right Eye Distance:   ?Left Eye Distance:   ?Bilateral Distance:   ? ?Right Eye Near:   ?Left Eye Near:    ?Bilateral Near:    ? ?Physical Exam ?Vitals and nursing note reviewed.  ?Constitutional:   ?   General: She is not in acute distress. ?   Appearance: Normal appearance. She is well-developed. She is not ill-appearing.  ?HENT:  ?   Head: Normocephalic and atraumatic.  ?   Right Ear: Tympanic membrane, ear canal and external ear normal.  ?   Left Ear: Tympanic membrane, ear canal and external ear normal.  ?   Nose: Nose normal.  ?   Mouth/Throat:  ?   Lips: Pink.  ?   Mouth: Mucous membranes are moist. Angioedema present.  ?   Tongue: No lesions. Tongue does not deviate from midline.  ?   Pharynx: Oropharynx is clear. Uvula midline.  ?   Tonsils: No tonsillar exudate or tonsillar abscesses.  ?   Comments: Airway patent.  ?Eyes:  ?   Extraocular Movements: Extraocular movements intact.  ?   Conjunctiva/sclera: Conjunctivae normal.  ?Cardiovascular:  ?   Rate and Rhythm: Normal rate and regular rhythm.  ?   Heart sounds: Normal heart sounds. No murmur heard. ?  No friction rub. No gallop.  ?Pulmonary:  ?   Effort: Pulmonary effort is normal. No respiratory distress.  ?   Breath sounds: Rhonchi present. No wheezing or rales.  ?   Comments: Mild rhonchi heard with auscultation to lower and mid lung fields bilaterally.  Patient is not in respiratory distress, but does have a productive cough. Rhonchi clears when she coughs. ?Chest:  ?   Chest wall: No tenderness.  ?Abdominal:  ?   Palpations: Abdomen is  soft.  ?   Tenderness: There is no abdominal tenderness. There is no right CVA tenderness or left CVA tenderness.  ?Musculoskeletal:     ?   General: No swelling.  ?   Cervical back: Neck supple.  ?Skin: ?   General: Skin is warm and dry.  ?  Capillary Refill: Capillary refill takes less than 2 seconds.  ?   Findings: No rash.  ?Neurological:  ?   General: No focal deficit present.  ?   Mental Status: She is alert and oriented to person, place, and time.  ?Psychiatric:     ?   Mood and Affect: Mood normal.     ?   Behavior: Behavior normal.     ?   Thought Content: Thought content normal.     ?   Judgment: Judgment normal.  ? ? ? ?UC Treatments / Results  ?Labs ?(all labs ordered are listed, but only abnormal results are displayed) ?Labs Reviewed - No data to display ? ?EKG ? ? ?Radiology ?No results found. ? ?Procedures ?Procedures (including critical care time) ? ?Medications Ordered in UC ?Medications  ?methylPREDNISolone sodium succinate (SOLU-MEDROL) 125 mg/2 mL injection 125 mg (125 mg Intramuscular Given 03/27/22 1302)  ? ? ?Initial Impression / Assessment and Plan / UC Course  ?I have reviewed the triage vital signs and the nursing notes. ? ?Pertinent labs & imaging results that were available during my care of the patient were reviewed by me and considered in my medical decision making (see chart for details). ? ?Patient is a 61 year old female presenting today with angioedema and cough that began 2 days ago. Angioedema decreased slightly with benadryl administration prior to urgent care arrival. Angioedema and cough are likely consistent with ACE inhibitor allergic reaction to lisinopril. Patient has been taking lisinopril at a stable dose for "a long time" and has never had this happen before. Her airway is patent, is is not in any acute respiratory distress, and she her vital signs are stable at this time. Deferred imaging due to stable cardiopulmonary exam and vital signs. Small rhonchi heard to  auscultation that cleared with cough. Steroid injection given today will likely help this. Patient is not in respiratory distress and is not complaining  of shortness of breath at this time. No suspicion for possible pneumo

## 2022-03-27 NOTE — Discharge Instructions (Addendum)
Please stop taking your lisinopril as this is likely the cause of your allergic reaction and your lip swelling.  ?We gave you a steroid injection in the office today, but I would also like for you to take steroid when you go home.  Please do not take any NSAID medications while you take the steroid for example ibuprofen, Motrin, Advil, aspirin, etc.  Please take prednisone steroid once a day with food for the next 5 days. ? ?Please also take Pepcid and cetirizine for the next 2 weeks as your body recovers from this allergic reaction that you had today. ? ?Call your primary care provider today to tell them that you had an allergic reaction to your blood pressure medicine and asked for an appointment to get started on a different medication for your blood pressure. ? ?If you develop any new or worsening symptoms or do not improve in the next 2 to 3 days, please return.  If your symptoms are severe, please go to the emergency room.  Follow-up with your primary care provider for further evaluation and management of your symptoms as well as ongoing wellness visits.  I hope you feel better! ? ?

## 2022-03-27 NOTE — ED Triage Notes (Signed)
C/o swelling in her face that started 2-3 days.  ?

## 2022-03-27 NOTE — ED Triage Notes (Signed)
Lip swelling.

## 2024-06-09 ENCOUNTER — Ambulatory Visit (HOSPITAL_COMMUNITY)
Admission: EM | Admit: 2024-06-09 | Discharge: 2024-06-09 | Disposition: A | Discharge status: Home / Self Care | Attending: Emergency Medicine | Admitting: Emergency Medicine

## 2024-06-09 ENCOUNTER — Encounter (HOSPITAL_COMMUNITY): Payer: Self-pay

## 2024-06-09 DIAGNOSIS — R21 Rash and other nonspecific skin eruption: Secondary | ICD-10-CM | POA: Diagnosis not present

## 2024-06-09 DIAGNOSIS — B20 Human immunodeficiency virus [HIV] disease: Secondary | ICD-10-CM

## 2024-06-09 MED ORDER — IBUPROFEN 800 MG PO TABS: 800.0000 mg | ORAL_TABLET | Freq: Three times a day (TID) | ORAL | 0 refills | Status: DC

## 2024-06-09 MED ORDER — CEPHALEXIN 500 MG PO CAPS: 500.0000 mg | ORAL_CAPSULE | Freq: Three times a day (TID) | ORAL | 0 refills | Status: AC

## 2024-06-09 MED ORDER — PREDNISONE 10 MG (21) PO TBPK: ORAL_TABLET | Freq: Every day | ORAL | 0 refills | Status: DC

## 2024-06-09 MED ORDER — IBUPROFEN 800 MG PO TABS
800.0000 mg | ORAL_TABLET | Freq: Once | ORAL | Status: AC
  Administered 2024-06-09: 800 mg via ORAL

## 2024-06-09 MED ORDER — IBUPROFEN 800 MG PO TABS
ORAL_TABLET | ORAL | Status: AC
  Filled 2024-06-09: qty 1

## 2024-06-09 NOTE — Discharge Instructions (Addendum)
 Take the antibiotics 3 times daily with food until completed.  Please follow-up with infectious disease to see if they want to modify any of your treatments.  Continue taking her Valtrex as prescribed.  Start the steroid taper back with breakfast and take this daily to help with the itching and inflammation.  Do not take ibuprofen  while you are on this medication as it can increase your risk for gastrointestinal bleeding.  If you have any pain you can take 500 mg of Tylenol  as needed.  If you have any continued pain after completing the prednisone  then you can start the ibuprofen  800 mg.

## 2024-06-09 NOTE — ED Triage Notes (Addendum)
 Patient has bilateral leg rash and irritation x 2 weeks.  Patient states she has used antibiotic ointment, alcohol , and A and D ointment.  Patient states she was without Valcyclovir for a week or more and is now taking again, but rash and open sores are worse.

## 2024-06-09 NOTE — ED Provider Notes (Signed)
 [facility]    CSN: [id number] Arrival date & time: 06/09/24  9051      History   Chief Complaint Chief Complaint  Patient presents with   Rash    HPI Christine Ward is a 63 y.o. female.   Patient presents to clinic over concern of an itchy pustular rash that is across her lower legs that has been present for the past 3 weeks.  She had some insurance issues and they were only shipping out 14 days of her Valtrex at a time which she normally takes 500 mg twice daily, and there was an issue where she was without her medication for around a week.  She has been since taking the Valtrex as she has had it available to her to take.  Denies any issues getting her HIV therapy and has been compliant.  For the itchy and painful rash she has been using topical antibiotic ointment, alcohol and A&D.  She is concerned because the pustular areas appear to be worse.  The history is provided by the patient and medical records.  Rash   Past Medical History:  Diagnosis Date   HIV (human immunodeficiency virus infection) (HCC)    Hypertension     There are no active problems to display for this patient.   Past Surgical History:  Procedure Laterality Date   TUBAL LIGATION      OB History   No obstetric history on file.      Home Medications    Prior to Admission medications   Medication Sig Start Date End Date Taking? Authorizing Provider  cephALEXin  (KEFLEX ) 500 MG capsule Take 1 capsule (500 mg total) by mouth 3 (three) times daily for 7 days. 06/09/24 06/16/24 Yes Javayah Magaw  N, FNP  ibuprofen  (ADVIL ) 800 MG tablet Take 1 tablet (800 mg total) by mouth 3 (three) times daily. 06/09/24  Yes Travonte Byard  N, FNP  predniSONE  (STERAPRED UNI-PAK 21 TAB) 10 MG (21) TBPK tablet Take by mouth daily. Take as prescribed. 06/09/24  Yes Nashea Chumney  N, FNP  aspirin 81 MG tablet Take 81 mg by mouth daily.    [provider]  Aspirin-Acetaminophen -Caffeine  (GOODY  HEADACHE PO) Take 1 packet by mouth every 6 (six) hours as needed (for headaches or pain).    [provider]  cetirizine  (ZYRTEC ) 10 MG tablet Take 1 tablet (10 mg total) by mouth daily. 03/27/22   Enedelia Dorna HERO, FNP  DESCOVY 200-25 MG tablet Take 1 tablet by mouth daily. 07/02/18   [provider]  doxycycline  (VIBRAMYCIN ) 100 MG capsule Take 1 capsule (100 mg total) by mouth 2 (two) times daily. Patient not taking: Reported on 07/16/2018 03/25/15   Zackowski, Scott, MD  famotidine  (PEPCID ) 20 MG tablet Take 1 tablet (20 mg total) by mouth 2 (two) times daily. 03/27/22   Enedelia Dorna HERO, FNP  hydrochlorothiazide (HYDRODIURIL) 25 MG tablet Take 25 mg by mouth daily.    [provider]  HYDROcodone -acetaminophen  (NORCO/VICODIN) 5-325 MG per tablet Take 1-2 tablets by mouth every 6 (six) hours as needed for moderate pain. Patient not taking: Reported on 07/16/2018 03/25/15   Zackowski, Scott, MD  lisinopril (PRINIVIL,ZESTRIL) 10 MG tablet Take 10 mg by mouth daily.    [provider]  simvastatin (ZOCOR) 20 MG tablet Take 20 mg by mouth daily.    [provider]  TIVICAY 50 MG tablet Take 50 mg by mouth daily.  07/02/18   [provider]  valACYclovir (VALTREX) 500 MG  tablet Take 500 mg by mouth 2 (two) times daily. 06/07/18   [provider]    Family History Family History  Problem Relation Age of Onset   Hypertension Mother    Diabetes Father     Social History Social History   Tobacco Use   Smoking status: Never   Smokeless tobacco: Never  Vaping Use   Vaping status: Never Used  Substance Use Topics   Alcohol use: Yes   Drug use: No     Allergies   Ace inhibitors   Review of Systems Review of Systems  Per HPI  Physical Exam Triage Vital Signs ED Triage Vitals  Encounter Vitals Group     BP 06/09/24 1017 128/88     Girls Systolic BP Percentile --      Girls Diastolic BP Percentile --      Boys Systolic  BP Percentile --      Boys Diastolic BP Percentile --      Pulse Rate 06/09/24 1017 (!) 50     Resp 06/09/24 1017 16     Temp 06/09/24 1017 98.3 F (36.8 C)     Temp Source 06/09/24 1017 Oral     SpO2 06/09/24 1017 98 %     Weight --      Height --      Head Circumference --      Peak Flow --      Pain Score 06/09/24 1020 8     Pain Loc --      Pain Education --      Exclude from Growth Chart --    No data found.  Updated Vital Signs BP 128/88 (BP Location: Right Arm)   Pulse (!) 50   Temp 98.3 F (36.8 C) (Oral)   Resp 16   SpO2 98%   Visual Acuity Right Eye Distance:   Left Eye Distance:   Bilateral Distance:    Right Eye Near:   Left Eye Near:    Bilateral Near:     Physical Exam Vitals and nursing note reviewed.  Constitutional:      Appearance: Normal appearance.  HENT:     Head: Normocephalic and atraumatic.     Right Ear: External ear normal.     Left Ear: External ear normal.     Nose: Nose normal.     Mouth/Throat:     Mouth: Mucous membranes are moist.  Eyes:     Conjunctiva/sclera: Conjunctivae normal.  Cardiovascular:     Rate and Rhythm: Normal rate.  Skin:    General: Skin is warm and dry.     Findings: Rash present.     Comments: Pustular lesions scattered across lower extremities with areas of scarring and hyperpigmentation.  See media.  Neurological:     General: No focal deficit present.     Mental Status: She is alert and oriented to person, place, and time.  Psychiatric:        Mood and Affect: Mood normal.        Behavior: Behavior normal.      Media Information  Document Information    UC Treatments / Results  Labs (all labs ordered are listed, but only abnormal results are displayed) Labs Reviewed - No data to display  EKG   Radiology No results found.  Procedures Procedures (including critical care time)  Medications Ordered in UC Medications  ibuprofen  (ADVIL ) tablet 800 mg (800 mg Oral Given 06/09/24 1107)     Initial Impression /  Assessment and Plan / UC Course  I have reviewed the triage vital signs and the nursing notes.  Pertinent labs & imaging results that were available during my care of the patient were reviewed by me and considered in my medical decision making (see chart for details).  Vitals and triage reviewed, patient is hemodynamically stable.  Pustular lesions scattered across the lower extremities concerning for infection, will treat with Keflex .  Steroid taper given for itchy rash.  Rash appears to be topical, infection does not appear to be systemic, afebrile.  ID follow-up for HIV management.  Plan of care, follow-up care return precautions given, no questions at this time.     Final Clinical Impressions(s) / UC Diagnoses   Final diagnoses:  Rash and nonspecific skin eruption  HIV disease (HCC)     Discharge Instructions      Take the antibiotics 3 times daily with food until completed.  Please follow-up with infectious disease to see if they want to modify any of your treatments.  Continue taking her Valtrex as prescribed.  Start the steroid taper back with breakfast and take this daily to help with the itching and inflammation.  Do not take ibuprofen  while you are on this medication as it can increase your risk for gastrointestinal bleeding.  If you have any pain you can take 500 mg of Tylenol  as needed.  If you have any continued pain after completing the prednisone  then you can start the ibuprofen  800 mg.    ED Prescriptions     Medication Sig Dispense Auth. Provider   cephALEXin  (KEFLEX ) 500 MG capsule Take 1 capsule (500 mg total) by mouth 3 (three) times daily for 7 days. 21 capsule Dreama, Sana Tessmer  N, FNP   predniSONE  (STERAPRED UNI-PAK 21 TAB) 10 MG (21) TBPK tablet Take by mouth daily. Take as prescribed. 21 tablet Dreama, Chris Narasimhan  N, FNP   ibuprofen  (ADVIL ) 800 MG tablet Take 1 tablet (800 mg total) by mouth 3 (three) times daily. 21 tablet Dreama,  Jerek Meulemans  N, FNP      PDMP not reviewed this encounter.   Dreama Omarii Scalzo  N, FNP 06/09/24 1116

## 2024-07-09 ENCOUNTER — Other Ambulatory Visit: Payer: Self-pay

## 2024-07-09 ENCOUNTER — Encounter (HOSPITAL_COMMUNITY): Payer: Self-pay | Admitting: *Deleted

## 2024-07-09 ENCOUNTER — Emergency Department (HOSPITAL_COMMUNITY)
Admission: EM | Admit: 2024-07-09 | Discharge: 2024-07-09 | Disposition: A | Discharge status: Home / Self Care | Attending: Emergency Medicine | Admitting: Emergency Medicine

## 2024-07-09 DIAGNOSIS — Z79899 Other long term (current) drug therapy: Secondary | ICD-10-CM | POA: Insufficient documentation

## 2024-07-09 DIAGNOSIS — I10 Essential (primary) hypertension: Secondary | ICD-10-CM | POA: Diagnosis not present

## 2024-07-09 DIAGNOSIS — Z21 Asymptomatic human immunodeficiency virus [HIV] infection status: Secondary | ICD-10-CM | POA: Insufficient documentation

## 2024-07-09 DIAGNOSIS — E876 Hypokalemia: Secondary | ICD-10-CM | POA: Insufficient documentation

## 2024-07-09 DIAGNOSIS — Z7982 Long term (current) use of aspirin: Secondary | ICD-10-CM | POA: Insufficient documentation

## 2024-07-09 DIAGNOSIS — R21 Rash and other nonspecific skin eruption: Secondary | ICD-10-CM | POA: Insufficient documentation

## 2024-07-09 LAB — COMPREHENSIVE METABOLIC PANEL WITH GFR
ALT: 15 U/L (ref 0–44)
AST: 18 U/L (ref 15–41)
Albumin: 2.9 g/dL — ABNORMAL LOW (ref 3.5–5.0)
Alkaline Phosphatase: 84 U/L (ref 38–126)
Anion gap: 8 (ref 5–15)
BUN: 10 mg/dL (ref 8–23)
CO2: 26 mmol/L (ref 22–32)
Calcium: 8.8 mg/dL — ABNORMAL LOW (ref 8.9–10.3)
Chloride: 102 mmol/L (ref 98–111)
Creatinine, Ser: 0.75 mg/dL (ref 0.44–1.00)
GFR, Estimated: >60 mL/min (ref >60)
Glucose, Bld: 108 mg/dL — ABNORMAL HIGH (ref 70–99)
Potassium: 2.8 mmol/L — ABNORMAL LOW (ref 3.5–5.1)
Sodium: 136 mmol/L (ref 135–145)
Total Bilirubin: 0.5 mg/dL (ref 0.0–1.2)
Total Protein: 7.3 g/dL (ref 6.5–8.1)

## 2024-07-09 LAB — CBC
HCT: 37 % (ref 36.0–46.0)
Hemoglobin: 12.3 g/dL (ref 12.0–15.0)
MCH: 32.2 pg (ref 26.0–34.0)
MCHC: 33.2 g/dL (ref 30.0–36.0)
MCV: 96.9 fL (ref 80.0–100.0)
Platelets: 489 K/uL — ABNORMAL HIGH (ref 150–400)
RBC: 3.82 MIL/uL — ABNORMAL LOW (ref 3.87–5.11)
RDW: 14.1 % (ref 11.5–15.5)
WBC: 9.7 K/uL (ref 4.0–10.5)
nRBC: 0 % (ref 0.0–0.2)

## 2024-07-09 MED ORDER — DOXYCYCLINE HYCLATE 100 MG PO TABS
100.0000 mg | ORAL_TABLET | Freq: Once | ORAL | Status: AC
  Administered 2024-07-09: 100 mg via ORAL
  Filled 2024-07-09: qty 1

## 2024-07-09 MED ORDER — DOXYCYCLINE HYCLATE 100 MG PO CAPS
100.0000 mg | ORAL_CAPSULE | Freq: Two times a day (BID) | ORAL | 0 refills | Status: DC
Start: 2024-07-09 — End: 2024-08-23

## 2024-07-09 MED ORDER — OXYCODONE-ACETAMINOPHEN 5-325 MG PO TABS
1.0000 | ORAL_TABLET | Freq: Once | ORAL | Status: AC
  Administered 2024-07-09: 1 via ORAL
  Filled 2024-07-09: qty 1

## 2024-07-09 MED ORDER — IBUPROFEN 800 MG PO TABS
800.0000 mg | ORAL_TABLET | Freq: Once | ORAL | Status: AC
  Administered 2024-07-09: 800 mg via ORAL
  Filled 2024-07-09: qty 1

## 2024-07-09 MED ORDER — IBUPROFEN 800 MG PO TABS
800.0000 mg | ORAL_TABLET | Freq: Three times a day (TID) | ORAL | 0 refills | Status: DC
Start: 2024-07-09 — End: 2024-08-23

## 2024-07-09 MED ORDER — DEXAMETHASONE SODIUM PHOSPHATE 10 MG/ML IJ SOLN
10.0000 mg | Freq: Once | INTRAMUSCULAR | Status: AC
  Administered 2024-07-09: 10 mg via INTRAMUSCULAR
  Filled 2024-07-09: qty 1

## 2024-07-09 MED ORDER — POTASSIUM CHLORIDE CRYS ER 20 MEQ PO TBCR
60.0000 meq | EXTENDED_RELEASE_TABLET | Freq: Once | ORAL | Status: AC
  Administered 2024-07-09: 60 meq via ORAL
  Filled 2024-07-09: qty 3

## 2024-07-09 MED ORDER — PREDNISONE 10 MG (21) PO TBPK: ORAL_TABLET | Freq: Every day | ORAL | 0 refills | Status: DC

## 2024-07-09 NOTE — ED Notes (Signed)
 Pt come to triage complaining of leg itching. RN notifed

## 2024-07-09 NOTE — ED Provider Notes (Signed)
 [place] EMERGENCY DEPARTMENT AT [facility] Provider Note   CSN: [id number] Arrival date & time: 07/09/24  9966     Patient presents with: leg rash   Christine Ward is a 63 y.o. female.   Patient with history of HIV, hypertension presents today with complaints of rash.  She reports that symptoms been present for the last 2 or 3 months.  Reports that her insurance changed and she missed a few doses of her Valtrex and when she restarted the medicine this rash broke out and has been persistent since then.  1 month ago she went to urgent care for this and was given Keflex  and steroids, reports that her symptoms improved while she was on these medications but came back soon after she finished the course of these.  Reports the rash is very itchy, she has some pockets of pus that have ruptured.  Reports the rashes unchanged in nature since onset, it is not getting better or worse.  Denies any fevers, chills.  No recent tick bites or joint pain.  She is compliant with her HIV regimen. Reports she has an appointment with her PCP next week to discuss management of this, however did not feel like she could wait that long.  The history is provided by the patient. No language interpreter was used.       Prior to Admission medications   Medication Sig Start Date End Date Taking? Authorizing Provider  aspirin 81 MG tablet Take 81 mg by mouth daily.    [provider]  Aspirin-Acetaminophen -Caffeine  (GOODY HEADACHE PO) Take 1 packet by mouth every 6 (six) hours as needed (for headaches or pain).    [provider]  cetirizine  (ZYRTEC ) 10 MG tablet Take 1 tablet (10 mg total) by mouth daily. 03/27/22   Enedelia Dorna HERO, FNP  DESCOVY 200-25 MG tablet Take 1 tablet by mouth daily. 07/02/18   [provider]  doxycycline  (VIBRAMYCIN ) 100 MG capsule Take 1 capsule (100 mg total) by mouth 2 (two) times daily. Patient not taking: Reported on 07/16/2018 03/25/15    Zackowski, Scott, MD  famotidine  (PEPCID ) 20 MG tablet Take 1 tablet (20 mg total) by mouth 2 (two) times daily. 03/27/22   Enedelia Dorna HERO, FNP  hydrochlorothiazide (HYDRODIURIL) 25 MG tablet Take 25 mg by mouth daily.    [provider]  HYDROcodone -acetaminophen  (NORCO/VICODIN) 5-325 MG per tablet Take 1-2 tablets by mouth every 6 (six) hours as needed for moderate pain. Patient not taking: Reported on 07/16/2018 03/25/15   Zackowski, Scott, MD  ibuprofen  (ADVIL ) 800 MG tablet Take 1 tablet (800 mg total) by mouth 3 (three) times daily. 06/09/24   Dreama, [place]  N, FNP  lisinopril (PRINIVIL,ZESTRIL) 10 MG tablet Take 10 mg by mouth daily.    [provider]  predniSONE  (STERAPRED UNI-PAK 21 TAB) 10 MG (21) TBPK tablet Take by mouth daily. Take as prescribed. 06/09/24   Dreama, [place]  N, FNP  simvastatin (ZOCOR) 20 MG tablet Take 20 mg by mouth daily.    [provider]  TIVICAY 50 MG tablet Take 50 mg by mouth daily.  07/02/18   [provider]  valACYclovir (VALTREX) 500 MG tablet Take 500 mg by mouth 2 (two) times daily. 06/07/18   [provider]    Allergies: Ace inhibitors    Review of Systems  Skin:  Positive for rash.  All other systems reviewed and are negative.   Updated Vital Signs BP (!) 127/91   Pulse ROLLEN)  50   Temp 97.7 F (36.5 C)   Resp 18   Ht 5' 2 (1.575 m)   Wt 99.8 kg   SpO2 100%   BMI 40.24 kg/m   Physical Exam Vitals and nursing note reviewed.  Constitutional:      General: She is not in acute distress.    Appearance: Normal appearance. She is normal weight. She is not ill-appearing, toxic-appearing or diaphoretic.  HENT:     Head: Normocephalic and atraumatic.     Mouth/Throat:     Comments: No mucosal lesions Cardiovascular:     Rate and Rhythm: Normal rate.  Pulmonary:     Effort: Pulmonary effort is normal. No respiratory distress.  Musculoskeletal:        General: Normal range of motion.      Cervical back: Normal range of motion.  Skin:    General: Skin is warm and dry.     Comments: Raised papular lesions scattered across lower extremities, worse on the thighs with a few pustules present, areas of scarring and hyperpigmentation. No erythema or warmth, no fluctuance or induration, no draining lesions.  Negative Nikolsky sign. No joint pain or swelling (extremely similar to photos from 7/17)  Neurological:     General: No focal deficit present.     Mental Status: She is alert.  Psychiatric:        Mood and Affect: Mood normal.        Behavior: Behavior normal.     (all labs ordered are listed, but only abnormal results are displayed) Labs Reviewed  COMPREHENSIVE METABOLIC PANEL WITH GFR - Abnormal; Notable for the following components:      Result Value   Potassium 2.8 (*)    Glucose, Bld 108 (*)    Calcium 8.8 (*)    Albumin 2.9 (*)    All other components within normal limits  CBC - Abnormal; Notable for the following components:   RBC 3.82 (*)    Platelets 489 (*)    All other components within normal limits    EKG: None  Radiology: No results found.   Procedures   Medications Ordered in the ED  potassium chloride  SA (KLOR-CON  M) CR tablet 60 mEq (has no administration in time range)  oxyCODONE -acetaminophen  (PERCOCET/ROXICET) 5-325 MG per tablet 1 tablet (has no administration in time range)  doxycycline  (VIBRA -TABS) tablet 100 mg (has no administration in time range)  dexamethasone  (DECADRON ) injection 10 mg (has no administration in time range)  ibuprofen  (ADVIL ) tablet 800 mg (800 mg Oral Given 07/09/24 0123)                                    Medical Decision Making Risk Prescription drug management.   This patient is a 63 y.o. female who presents to the ED for concern of rash, this involves an extensive number of treatment options, and is a complaint that carries with it a high risk of complications and morbidity. The emergent differential  diagnosis prior to evaluation includes, but is not limited to,  SJS/TEN, contact dermatitis, syphilis, tick-born illness, STI . This is not an exhaustive differential.   Past Medical History / Co-morbidities / Social History:  has a past medical history of HIV (human immunodeficiency virus infection) (HCC) and Hypertension.  Additional history: Chart reviewed. Pertinent results include: seen for this rash on 7/17 at urgent care, prescribed Keflex , prednisone , and ibuprofen .  Imaging in  chart extremely similar to present rash.  Physical Exam: Physical exam performed. The pertinent findings include:   Raised papular lesions scattered across lower extremities, worse on the thighs with a few pustules present, areas of scarring and hyperpigmentation. No erythema or warmth, no fluctuance or induration, no draining lesions.  Negative Nikolsky sign. No joint pain or swelling (extremely similar to photos from 7/17)  Lab Tests: I ordered, and personally interpreted labs.  The pertinent results include:  K 2.8, likely due to hydrochlorothiazide use, no leukocytosis   Medications: I ordered medication including oral potassium for hypokalemia, doxycycline , decadron , percocet  for pain, rash. Reevaluation of the patient after these medicines showed that the patient improved. I have reviewed the patients home medicines and have made adjustments as needed.   Disposition: After consideration of the diagnostic results and the patients response to treatment, I feel that emergency department workup does not suggest an emergent condition requiring admission or immediate intervention beyond what has been performed at this time. The plan is: discharge with doxycycline , steroids, close outpatient follow-up and return precautions. She does have pustules, therefore will cover for infection with doxycycline . Given steroids due to itching.  No blisters, no warmth, no draining sinus tracts, no superficial abscesses, no  bullous impetigo, no vesicles, no desquamation, no target lesions with dusky purpura or a central bulla. Not tender to touch.  No concern for SJS, TEN, TSS, tick borne illness, syphilis or other life-threatening condition. Recommend close pcp follow-up, she has an appointment next week. Will also give information for dermatology. Evaluation and diagnostic testing in the emergency department does not suggest an emergent condition requiring admission or immediate intervention beyond what has been performed at this time.  Plan for discharge with close PCP follow-up.  Patient is understanding and amenable with plan, educated on red flag symptoms that would prompt immediate return.  Patient discharged in stable condition.  Findings and plan of care discussed with supervising physician Dr. Elnor who is in agreement.   Final diagnoses:  Rash  Hypokalemia    ED Discharge Orders          Ordered    doxycycline  (VIBRAMYCIN ) 100 MG capsule  2 times daily        07/09/24 1001    predniSONE  (STERAPRED UNI-PAK 21 TAB) 10 MG (21) TBPK tablet  Daily        07/09/24 1001          An After Visit Summary was printed and given to the patient.      Nora Lauraine LABOR, PA-C 07/09/24 1002    Elnor Savant A, DO 07/10/24 SHERRIAN

## 2024-07-09 NOTE — Discharge Instructions (Addendum)
 We have given you a prescription for antibiotics and steroids for you to take as prescribed in's entirety for management of your rash.  Ultimately, you should follow-up with both your PCP and see a dermatologist for management of this.  I have given you information with a number to call to schedule an appointment.  Please do so at your earliest convenience.  In the interim, please take the antibiotics as prescribed.  Please clean your skin thoroughly with soap and water.  Do not use alcohol or benzyl peroxide as that delays the wound healing process.  I have given you steroids which should help with your itching.  You can also use Benadryl  or Zyrtec  which she can get over-the-counter at your local pharmacy to help with itching as well.  Additionally, your potassium was a little low today.  We have given you oral replacement.  When you see your PCP next you should have this rechecked to ensure it has normalized.  Return if development of any new or worsening symptoms.

## 2024-07-09 NOTE — ED Triage Notes (Signed)
 The pt  has a rash over both her legs for 2-3 months  the lesions appear infected and she report much pain

## 2024-08-23 ENCOUNTER — Encounter (HOSPITAL_COMMUNITY): Payer: Self-pay

## 2024-08-23 ENCOUNTER — Ambulatory Visit (HOSPITAL_COMMUNITY)
Admission: EM | Admit: 2024-08-23 | Discharge: 2024-08-23 | Disposition: A | Discharge status: Home / Self Care | Attending: Emergency Medicine | Admitting: Emergency Medicine

## 2024-08-23 DIAGNOSIS — R21 Rash and other nonspecific skin eruption: Secondary | ICD-10-CM

## 2024-08-23 MED ORDER — DOXYCYCLINE HYCLATE 100 MG PO TABS: 100.0000 mg | ORAL_TABLET | Freq: Two times a day (BID) | ORAL | 0 refills | Status: AC

## 2024-08-23 MED ORDER — IBUPROFEN 800 MG PO TABS: 800.0000 mg | ORAL_TABLET | Freq: Three times a day (TID) | ORAL | 0 refills | Status: DC

## 2024-08-23 MED ORDER — PREDNISONE 10 MG (21) PO TBPK: ORAL_TABLET | Freq: Every day | ORAL | 0 refills | Status: DC

## 2024-08-23 NOTE — ED Provider Notes (Signed)
 [facility]    CSN: [id number] Arrival date & time: 08/23/24  1017      History   Chief Complaint Chief Complaint  Patient presents with   Rash    HPI Christine Ward is a 63 y.o. female.   Patient presents to clinic over concern of a rash that is present to bilateral legs that has been ongoing since July.  Has been seen twice for this rash, given oral steroid taper and antibiotics.  Initially she had Keflex , then doxycycline .  Feels like the doxycycline  worked better for her.  She is scheduled to follow-up with dermatology sometime next year.  Does have history of HIV, is well-controlled, follows closely with ID.  Would like a refill on her ibuprofen .  Reports she knows she is not supposed to take this when she is taking her steroids due to risk of gastric bleeding, she does not take ibuprofen  at the same time as her oral steroids.  Has not had fever.  Rash is very itchy.  The history is provided by the patient and medical records.  Rash   Past Medical History:  Diagnosis Date   HIV (human immunodeficiency virus infection) (HCC)    Hypertension     There are no active problems to display for this patient.   Past Surgical History:  Procedure Laterality Date   TUBAL LIGATION      OB History   No obstetric history on file.      Home Medications    Prior to Admission medications   Medication Sig Start Date End Date Taking? Authorizing Provider  aspirin 81 MG tablet Take 81 mg by mouth daily.   Yes [provider]  Aspirin-Acetaminophen -Caffeine  (GOODY HEADACHE PO) Take 1 packet by mouth every 6 (six) hours as needed (for headaches or pain).   Yes [provider]  cetirizine  (ZYRTEC ) 10 MG tablet Take 1 tablet (10 mg total) by mouth daily. 03/27/22  Yes Enedelia Dorna HERO, FNP  DESCOVY 200-25 MG tablet Take 1 tablet by mouth daily. 07/02/18  Yes [provider]  doxycycline  (VIBRA -TABS) 100 MG tablet Take 1 tablet (100 mg  total) by mouth 2 (two) times daily for 14 days. 08/23/24 09/06/24 Yes Obie Silos  N, FNP  famotidine  (PEPCID ) 20 MG tablet Take 1 tablet (20 mg total) by mouth 2 (two) times daily. 03/27/22  Yes Enedelia Dorna HERO, FNP  hydrochlorothiazide (HYDRODIURIL) 25 MG tablet Take 25 mg by mouth daily.   Yes [provider]  HYDROcodone -acetaminophen  (NORCO/VICODIN) 5-325 MG per tablet Take 1-2 tablets by mouth every 6 (six) hours as needed for moderate pain. 03/25/15  Yes Zackowski, Scott, MD  ibuprofen  (ADVIL ) 800 MG tablet Take 1 tablet (800 mg total) by mouth 3 (three) times daily. 08/23/24  Yes Quantavia Frith  N, FNP  lisinopril (PRINIVIL,ZESTRIL) 10 MG tablet Take 10 mg by mouth daily.   Yes [provider]  predniSONE  (STERAPRED UNI-PAK 21 TAB) 10 MG (21) TBPK tablet Take by mouth daily. Take as prescribed 08/23/24  Yes Markie Frith  N, FNP  simvastatin (ZOCOR) 20 MG tablet Take 20 mg by mouth daily.   Yes [provider]  TIVICAY 50 MG tablet Take 50 mg by mouth daily.  07/02/18  Yes [provider]  valACYclovir (VALTREX) 500 MG tablet Take 500 mg by mouth 2 (two) times daily. 06/07/18  Yes [provider]    Family History Family History  Problem Relation Age of Onset   Hypertension Mother  Diabetes Father     Social History Social History   Tobacco Use   Smoking status: Never   Smokeless tobacco: Never  Vaping Use   Vaping status: Never Used  Substance Use Topics   Alcohol use: Yes   Drug use: No     Allergies   Ace inhibitors   Review of Systems Review of Systems  Per HPI  Physical Exam Triage Vital Signs ED Triage Vitals  Encounter Vitals Group     BP 08/23/24 1053 (!) 140/82     Girls Systolic BP Percentile --      Girls Diastolic BP Percentile --      Boys Systolic BP Percentile --      Boys Diastolic BP Percentile --      Pulse Rate 08/23/24 1053 (!) 57     Resp 08/23/24 1053 18     Temp 08/23/24 1053 98.2  F (36.8 C)     Temp Source 08/23/24 1053 Oral     SpO2 08/23/24 1053 98 %     Weight --      Height --      Head Circumference --      Peak Flow --      Pain Score 08/23/24 1059 0     Pain Loc --      Pain Education --      Exclude from Growth Chart --    No data found.  Updated Vital Signs BP (!) 140/82 (BP Location: Left Arm)   Pulse (!) 57   Temp 98.2 F (36.8 C) (Oral)   Resp 18   SpO2 98%   Visual Acuity Right Eye Distance:   Left Eye Distance:   Bilateral Distance:    Right Eye Near:   Left Eye Near:    Bilateral Near:     Physical Exam Vitals and nursing note reviewed.  Constitutional:      Appearance: Normal appearance.  HENT:     Head: Normocephalic and atraumatic.     Right Ear: External ear normal.     Left Ear: External ear normal.     Nose: Nose normal.     Mouth/Throat:     Mouth: Mucous membranes are moist.  Eyes:     Conjunctiva/sclera: Conjunctivae normal.  Cardiovascular:     Rate and Rhythm: Normal rate.  Skin:    General: Skin is warm and dry.     Findings: Rash present.     Comments: Scattered lesions across the legs.  Areas of pustules.  Areas of hyperpigmentation.  Presentation consistent with media pictures taken in July.  Neurological:     General: No focal deficit present.     Mental Status: She is alert.  Psychiatric:        Mood and Affect: Mood normal.      UC Treatments / Results  Labs (all labs ordered are listed, but only abnormal results are displayed) Labs Reviewed - No data to display  EKG   Radiology No results found.  Procedures Procedures (including critical care time)  Medications Ordered in UC Medications - No data to display  Initial Impression / Assessment and Plan / UC Course  I have reviewed the triage vital signs and the nursing notes.  Pertinent labs & imaging results that were available during my care of the patient were reviewed by me and considered in my medical decision making (see chart  for details).  Vitals and triage reviewed, patient is hemodynamically stable.  Persistent rash  with areas of pustules and hyperpigmentation, urticarial.  See media photo from visit in July for further depiction.  Will restart on oral steroids, discussed risks of continued oral steroid use.  Doxycycline  seem to work better, will restart on this.  Infection does not appear to be systemic, without fever or tachycardia.  Rash appears to be isolated to the legs.  Ambulatory referral to dermatology in hopes of sooner appointment than March or April 2026.  Plan of care, follow-up care return precautions given, no questions at this time.     Final Clinical Impressions(s) / UC Diagnoses   Final diagnoses:  Rash     Discharge Instructions      Take the steroids in the morning with breakfast.  Take the doxycycline , antibiotic, twice daily with food.  This medication can make you more prone to sunburn.  I have sent in a refill of your ibuprofen , do not take this while you are taking your steroid as it can increase the risk of gastric bleeding.  I have sent in a referral to dermatology.  If you do not hear from them in the next week or 2 please reach out to schedule.  Return to clinic for new or urgent symptoms.     ED Prescriptions     Medication Sig Dispense Auth. Provider   doxycycline  (VIBRA -TABS) 100 MG tablet Take 1 tablet (100 mg total) by mouth 2 (two) times daily for 14 days. 28 tablet Ramona Slinger  N, FNP   predniSONE  (STERAPRED UNI-PAK 21 TAB) 10 MG (21) TBPK tablet Take by mouth daily. Take as prescribed 21 tablet Dreama, Brenn Gatton  N, FNP   ibuprofen  (ADVIL ) 800 MG tablet Take 1 tablet (800 mg total) by mouth 3 (three) times daily. 21 tablet Dreama, Vlasta Baskin  N, FNP      PDMP not reviewed this encounter.   Dreama Brigid SAILOR, FNP 08/23/24 1134

## 2024-08-23 NOTE — Discharge Instructions (Signed)
 Take the steroids in the morning with breakfast.  Take the doxycycline , antibiotic, twice daily with food.  This medication can make you more prone to sunburn.  I have sent in a refill of your ibuprofen , do not take this while you are taking your steroid as it can increase the risk of gastric bleeding.  I have sent in a referral to dermatology.  If you do not hear from them in the next week or 2 please reach out to schedule.  Return to clinic for new or urgent symptoms.

## 2024-08-23 NOTE — ED Triage Notes (Signed)
 Patient presents with bilateral leg rash x 2 months. Patient has upcoming dermatology appointment. Patient is requesting steroids to help with her symptoms.

## 2024-12-16 ENCOUNTER — Ambulatory Visit
Admission: EM | Admit: 2024-12-16 | Discharge: 2024-12-16 | Disposition: A | Discharge status: Home / Self Care | Attending: Family Medicine | Admitting: Family Medicine

## 2024-12-16 DIAGNOSIS — R21 Rash and other nonspecific skin eruption: Secondary | ICD-10-CM

## 2024-12-16 MED ORDER — PREDNISONE 10 MG (21) PO TBPK: ORAL_TABLET | Freq: Every day | ORAL | 0 refills | Status: AC

## 2024-12-16 MED ORDER — DOXYCYCLINE HYCLATE 100 MG PO CAPS: 100.0000 mg | ORAL_CAPSULE | Freq: Two times a day (BID) | ORAL | 0 refills | Status: AC

## 2024-12-16 MED ORDER — IBUPROFEN 800 MG PO TABS: 800.0000 mg | ORAL_TABLET | Freq: Three times a day (TID) | ORAL | 0 refills | Status: AC

## 2024-12-16 NOTE — Discharge Instructions (Addendum)
 You were seen today for continued rash. As you have benefited from antibiotics and steroids in the past, I have sent these to your pharmacy again today for treatment.  I have sent out a refill of the motrin  800mg  as well.  Please follow up with the dermatologist as already scheduled in several months.  Return as needed.

## 2024-12-16 NOTE — ED Provider Notes (Signed)
 " [facility]    CSN: [id number] Arrival date & time: 12/16/24  0920      History   Chief Complaint No chief complaint on file.   HPI Christine Ward is a 64 y.o. female.   Patient is here for a rash on her legs that has been on ongoing since July 2025.   She has h/o HIV (followed by ID) and HTN.  She has been seen multiple times for this rash in the UC and ER.  Initially given keflex  and steroid, which helped slightly.  The last 2 visits she has been given doxy and steroid, which helps better.  Her last visit was 07/2024 for this problem.  She does have an appointment with Derm 02/2025.  She states the rash is quite painful, and requesting motrin  as well.  She has noted the rash on her wrists lately, which is new.     Past Medical History:  Diagnosis Date   HIV (human immunodeficiency virus infection) (HCC)    Hypertension     There are no active problems to display for this patient.   Past Surgical History:  Procedure Laterality Date   TUBAL LIGATION      OB History   No obstetric history on file.      Home Medications    Prior to Admission medications  Medication Sig Start Date End Date Taking? Authorizing Provider  aspirin 81 MG tablet Take 81 mg by mouth daily.    [provider]  Aspirin-Acetaminophen -Caffeine  (GOODY HEADACHE PO) Take 1 packet by mouth every 6 (six) hours as needed (for headaches or pain).    [provider]  cetirizine  (ZYRTEC ) 10 MG tablet Take 1 tablet (10 mg total) by mouth daily. 03/27/22   Enedelia Dorna HERO, FNP  DESCOVY 200-25 MG tablet Take 1 tablet by mouth daily. 07/02/18   [provider]  famotidine  (PEPCID ) 20 MG tablet Take 1 tablet (20 mg total) by mouth 2 (two) times daily. 03/27/22   Enedelia Dorna HERO, FNP  hydrochlorothiazide (HYDRODIURIL) 25 MG tablet Take 25 mg by mouth daily.    [provider]  HYDROcodone -acetaminophen  (NORCO/VICODIN) 5-325 MG per tablet Take 1-2  tablets by mouth every 6 (six) hours as needed for moderate pain. 03/25/15   Zackowski, Scott, MD  ibuprofen  (ADVIL ) 800 MG tablet Take 1 tablet (800 mg total) by mouth 3 (three) times daily. 08/23/24   Ball, [place]  G, FNP  lisinopril (PRINIVIL,ZESTRIL) 10 MG tablet Take 10 mg by mouth daily.    [provider]  predniSONE  (STERAPRED UNI-PAK 21 TAB) 10 MG (21) TBPK tablet Take by mouth daily. Take as prescribed 08/23/24   Ball, [place]  G, FNP  simvastatin (ZOCOR) 20 MG tablet Take 20 mg by mouth daily.    [provider]  TIVICAY 50 MG tablet Take 50 mg by mouth daily.  07/02/18   [provider]  valACYclovir (VALTREX) 500 MG tablet Take 500 mg by mouth 2 (two) times daily. 06/07/18   [provider]    Family History Family History  Problem Relation Age of Onset   Hypertension Mother    Diabetes Father     Social History Social History[1]   Allergies   Ace inhibitors   Review of Systems Review of Systems  Constitutional: Negative.   HENT: Negative.    Respiratory: Negative.    Cardiovascular: Negative.   Gastrointestinal: Negative.   Skin:  Positive for rash.     Physical Exam Triage Vital  Signs ED Triage Vitals  Encounter Vitals Group     BP      Girls Systolic BP Percentile      Girls Diastolic BP Percentile      Boys Systolic BP Percentile      Boys Diastolic BP Percentile      Pulse      Resp      Temp      Temp src      SpO2      Weight      Height      Head Circumference      Peak Flow      Pain Score      Pain Loc      Pain Education      Exclude from Growth Chart    No data found.  Updated Vital Signs There were no vitals taken for this visit.  Visual Acuity Right Eye Distance:   Left Eye Distance:   Bilateral Distance:    Right Eye Near:   Left Eye Near:    Bilateral Near:     Physical Exam Constitutional:      Appearance: Normal appearance. She is normal weight.  Skin:    Comments: Scattered  lesions/rash on her thighs bilaterally, and left wrist.  Some pustules are noted;  areas of hypo/hyperpigmentation.  No drainage or abscess noted today  Neurological:     Mental Status: She is alert.      UC Treatments / Results  Labs (all labs ordered are listed, but only abnormal results are displayed) Labs Reviewed - No data to display  EKG   Radiology No results found.  Procedures Procedures (including critical care time)  Medications Ordered in UC Medications - No data to display  Initial Impression / Assessment and Plan / UC Course  I have reviewed the triage vital signs and the nursing notes.  Pertinent labs & imaging results that were available during my care of the patient were reviewed by me and considered in my medical decision making (see chart for details).   Final Clinical Impressions(s) / UC Diagnoses   Final diagnoses:  Rash and nonspecific skin eruption     Discharge Instructions      You were seen today for continued rash. As you have benefited from antibiotics and steroids in the past, I have sent these to your pharmacy again today for treatment.  I have sent out a refill of the motrin  800mg  as well.  Please follow up with the dermatologist as already scheduled in several months.  Return as needed.     ED Prescriptions     Medication Sig Dispense Auth. Provider   doxycycline  (VIBRAMYCIN ) 100 MG capsule Take 1 capsule (100 mg total) by mouth 2 (two) times daily for 14 days. 28 capsule Jeannemarie Sawaya, MD   ibuprofen  (ADVIL ) 800 MG tablet Take 1 tablet (800 mg total) by mouth 3 (three) times daily. 21 tablet Trindon Dorton, MD   predniSONE  (STERAPRED UNI-PAK 21 TAB) 10 MG (21) TBPK tablet Take by mouth daily. Take 6 tabs by mouth daily  for 2 days, then 5 tabs for 2 days, then 4 tabs for 2 days, then 3 tabs for 2 days, 2 tabs for 2 days, then 1 tab by mouth daily for 2 days 42 tablet Rosamund Nyland, MD      PDMP not reviewed this encounter.      [1]  Social History Tobacco Use   Smoking status: Never   Smokeless  tobacco: Never  Vaping Use   Vaping status: Never Used  Substance Use Topics   Alcohol use: Yes   Drug use: No     Darral Longs, MD 12/16/24 [phone]  "

## 2024-12-16 NOTE — ED Triage Notes (Signed)
 Pt reports she been having a rash on bilateral thighs x 3 weeks. Pt states she was seen for the same on 08/23/24, and was given steroids and antibiotics. Pt states the rash cleared up when she took the medications, but has since returned. Pt has been taking motrin  at home with little relief. Last dose was yesterday.

## 2025-03-22 ENCOUNTER — Ambulatory Visit: Admitting: Dermatology
